# Patient Record
Sex: Female | Born: 1975 | Race: White | Hispanic: No | Marital: Married | Smoking: Former smoker
Health system: Southern US, Community
[De-identification: ages and names within clinical notes are randomized; demographics above are authoritative.]

## PROBLEM LIST (undated history)

## (undated) DIAGNOSIS — E785 Hyperlipidemia, unspecified: Secondary | ICD-10-CM

## (undated) DIAGNOSIS — K589 Irritable bowel syndrome without diarrhea: Secondary | ICD-10-CM

## (undated) HISTORY — DX: Hyperlipidemia, unspecified: E78.5

## (undated) HISTORY — PX: EXPLORATORY LAPAROTOMY: SUR591

## (undated) HISTORY — DX: Irritable bowel syndrome without diarrhea: K58.9

---

## 2005-10-16 ENCOUNTER — Emergency Department: Payer: Self-pay | Admitting: Emergency Medicine

## 2005-10-16 ENCOUNTER — Other Ambulatory Visit: Payer: Self-pay

## 2005-11-01 ENCOUNTER — Observation Stay: Payer: Self-pay | Admitting: Obstetrics and Gynecology

## 2006-05-23 ENCOUNTER — Encounter: Payer: Self-pay | Admitting: Obstetrics and Gynecology

## 2007-11-06 ENCOUNTER — Ambulatory Visit: Payer: Self-pay | Admitting: Family Medicine

## 2007-11-06 ENCOUNTER — Encounter: Payer: Self-pay | Admitting: Obstetrics & Gynecology

## 2007-12-17 ENCOUNTER — Ambulatory Visit: Payer: Self-pay | Admitting: Family Medicine

## 2007-12-17 DIAGNOSIS — M4120 Other idiopathic scoliosis, site unspecified: Secondary | ICD-10-CM | POA: Insufficient documentation

## 2007-12-17 DIAGNOSIS — N809 Endometriosis, unspecified: Secondary | ICD-10-CM | POA: Insufficient documentation

## 2007-12-17 DIAGNOSIS — R5383 Other fatigue: Secondary | ICD-10-CM

## 2007-12-17 DIAGNOSIS — E785 Hyperlipidemia, unspecified: Secondary | ICD-10-CM | POA: Insufficient documentation

## 2007-12-17 DIAGNOSIS — R5381 Other malaise: Secondary | ICD-10-CM

## 2007-12-17 DIAGNOSIS — K589 Irritable bowel syndrome without diarrhea: Secondary | ICD-10-CM

## 2008-01-30 ENCOUNTER — Ambulatory Visit: Payer: Self-pay | Admitting: Family Medicine

## 2008-01-30 DIAGNOSIS — J069 Acute upper respiratory infection, unspecified: Secondary | ICD-10-CM | POA: Insufficient documentation

## 2008-05-16 ENCOUNTER — Telehealth: Payer: Self-pay | Admitting: Family Medicine

## 2008-07-31 ENCOUNTER — Ambulatory Visit: Payer: Self-pay | Admitting: Family Medicine

## 2008-07-31 DIAGNOSIS — K921 Melena: Secondary | ICD-10-CM | POA: Insufficient documentation

## 2008-07-31 DIAGNOSIS — R1031 Right lower quadrant pain: Secondary | ICD-10-CM

## 2008-07-31 DIAGNOSIS — K5900 Constipation, unspecified: Secondary | ICD-10-CM | POA: Insufficient documentation

## 2008-08-04 LAB — CONVERTED CEMR LAB
ALT: 16 units/L (ref 0–35)
AST: 13 units/L (ref 0–37)
BUN: 9 mg/dL (ref 6–23)
Basophils Absolute: 0.1 10*3/uL (ref 0.0–0.1)
Basophils Relative: 1.4 % (ref 0.0–3.0)
Bilirubin, Direct: 0.1 mg/dL (ref 0.0–0.3)
CO2: 26 meq/L (ref 19–32)
Folate: 14.3 ng/mL
GFR calc Af Amer: 126 mL/min
Glucose, Bld: 90 mg/dL (ref 70–99)
HCT: 35.2 % — ABNORMAL LOW (ref 36.0–46.0)
Hemoglobin: 12.2 g/dL (ref 12.0–15.0)
Lymphocytes Relative: 36.9 % (ref 12.0–46.0)
MCV: 90.5 fL (ref 78.0–100.0)
Monocytes Absolute: 0.5 10*3/uL (ref 0.1–1.0)
Neutrophils Relative %: 52.4 % (ref 43.0–77.0)
Platelets: 223 10*3/uL (ref 150–400)
RBC: 3.89 M/uL (ref 3.87–5.11)
TSH: 2.32 microintl units/mL (ref 0.35–5.50)
Total Bilirubin: 0.4 mg/dL (ref 0.3–1.2)
Total Protein: 7 g/dL (ref 6.0–8.3)
Vitamin B-12: 395 pg/mL (ref 211–911)
WBC: 6.5 10*3/uL (ref 4.5–10.5)

## 2008-08-05 ENCOUNTER — Ambulatory Visit: Payer: Self-pay | Admitting: Gastroenterology

## 2008-09-30 ENCOUNTER — Ambulatory Visit: Payer: Self-pay | Admitting: Gastroenterology

## 2008-10-14 ENCOUNTER — Ambulatory Visit: Payer: Self-pay | Admitting: Internal Medicine

## 2008-10-14 ENCOUNTER — Telehealth: Payer: Self-pay | Admitting: Internal Medicine

## 2008-10-24 ENCOUNTER — Telehealth (INDEPENDENT_AMBULATORY_CARE_PROVIDER_SITE_OTHER): Payer: Self-pay | Admitting: *Deleted

## 2008-10-24 ENCOUNTER — Telehealth: Payer: Self-pay | Admitting: Gastroenterology

## 2008-10-24 ENCOUNTER — Ambulatory Visit: Payer: Self-pay | Admitting: Gastroenterology

## 2008-10-24 LAB — CONVERTED CEMR LAB: hCG, Beta Chain, Quant, S: 0.62 milliintl units/mL

## 2008-10-27 ENCOUNTER — Ambulatory Visit: Payer: Self-pay | Admitting: Gastroenterology

## 2008-10-28 ENCOUNTER — Telehealth (INDEPENDENT_AMBULATORY_CARE_PROVIDER_SITE_OTHER): Payer: Self-pay | Admitting: *Deleted

## 2008-11-17 ENCOUNTER — Encounter: Payer: Self-pay | Admitting: Obstetrics and Gynecology

## 2008-11-17 ENCOUNTER — Ambulatory Visit: Payer: Self-pay | Admitting: Obstetrics and Gynecology

## 2009-02-05 ENCOUNTER — Ambulatory Visit: Payer: Self-pay | Admitting: Obstetrics and Gynecology

## 2009-04-28 ENCOUNTER — Ambulatory Visit: Payer: Self-pay | Admitting: Obstetrics and Gynecology

## 2009-08-04 ENCOUNTER — Ambulatory Visit: Payer: Self-pay | Admitting: Obstetrics & Gynecology

## 2009-08-25 ENCOUNTER — Ambulatory Visit: Payer: Self-pay | Admitting: Family Medicine

## 2009-08-25 DIAGNOSIS — J019 Acute sinusitis, unspecified: Secondary | ICD-10-CM | POA: Insufficient documentation

## 2009-08-28 ENCOUNTER — Telehealth: Payer: Self-pay | Admitting: Family Medicine

## 2010-04-20 ENCOUNTER — Ambulatory Visit: Payer: Self-pay | Admitting: Family Medicine

## 2010-05-05 ENCOUNTER — Ambulatory Visit: Payer: Self-pay | Admitting: Obstetrics and Gynecology

## 2010-05-05 LAB — CONVERTED CEMR LAB
RDW: 14.1 % (ref 11.5–15.5)
WBC: 9.5 10*3/uL (ref 4.0–10.5)

## 2010-06-25 ENCOUNTER — Ambulatory Visit: Payer: Self-pay | Admitting: Family Medicine

## 2010-06-25 DIAGNOSIS — N644 Mastodynia: Secondary | ICD-10-CM | POA: Insufficient documentation

## 2010-07-05 ENCOUNTER — Ambulatory Visit: Payer: Self-pay | Admitting: Obstetrics & Gynecology

## 2010-07-05 LAB — CONVERTED CEMR LAB
Antibody Screen: NEGATIVE
Basophils Relative: 1 % (ref 0–1)
Eosinophils Absolute: 0.1 10*3/uL (ref 0.0–0.7)
Eosinophils Relative: 2 % (ref 0–5)
Hemoglobin: 12.4 g/dL (ref 12.0–15.0)
Hepatitis B Surface Ag: NEGATIVE
Monocytes Absolute: 0.5 10*3/uL (ref 0.1–1.0)
Monocytes Relative: 7 % (ref 3–12)
Platelets: 256 10*3/uL (ref 150–400)
RBC: 4.05 M/uL (ref 3.87–5.11)
RDW: 13.4 % (ref 11.5–15.5)
WBC: 7.8 10*3/uL (ref 4.0–10.5)
hCG, Beta Chain, Quant, S: 479.2 milliintl units/mL

## 2010-07-12 ENCOUNTER — Ambulatory Visit: Payer: Self-pay | Admitting: Obstetrics and Gynecology

## 2010-07-12 ENCOUNTER — Encounter: Payer: Self-pay | Admitting: Obstetrics & Gynecology

## 2010-07-26 ENCOUNTER — Ambulatory Visit (HOSPITAL_COMMUNITY): Admission: RE | Admit: 2010-07-26 | Payer: Self-pay | Admitting: Family Medicine

## 2010-07-27 ENCOUNTER — Ambulatory Visit: Payer: Self-pay | Admitting: Obstetrics & Gynecology

## 2010-08-04 ENCOUNTER — Ambulatory Visit: Payer: Self-pay | Admitting: Obstetrics & Gynecology

## 2010-08-05 ENCOUNTER — Emergency Department: Payer: Self-pay | Admitting: Emergency Medicine

## 2010-09-09 ENCOUNTER — Encounter: Payer: Self-pay | Admitting: Maternal & Fetal Medicine

## 2010-09-21 NOTE — Progress Notes (Signed)
Summary: sinus infection no better/cold sore  Phone Note Call from Patient Call back at Work Phone [phone]   Caller: Patient Call For: [name] Summary of Call: Patient was seen on Tuesday for sinus infection and it doesn't seem to be getting any better. Wants to know if antibiotic needs more time or if she needs something different called in. She says that she also has a cold sore now and would like something called in for that as well. [facility]. Pleas advise.  Initial call taken by: Melody Comas,  August 28, 2009 4:10 PM  Follow-up for Phone Call        Give time at least to day 8-10 of antibitoics.  Follow-up by: [name],  August 28, 2009 5:12 PM  Additional Follow-up for Phone Call Additional follow up Details #1::        Patient notified as instructed.  She says she would like something called in for the fever blisters.  I advised her to use OTC medication for the fever blisters but she wants something called in to make them go away and or to prevent them from coming back.  Please advise Additional Follow-up by: Linde Gillis CMA Duncan Dull),  August 31, 2009 9:18 AM    Additional Follow-up for Phone Call Additional follow up Details #2::    Patient called and says that she is still not feeling any better and that she feels that it has had time to kick in. Also still want something called in for the cold sore to the [facility]. 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 Chronic medications are not used on a daily basis for cold sores, but OK to use at outbreak.  call in Additional Follow-up by: Hannah Beat Edwards,  August 31, 2009 10:25 AM  New/Updated Medications: AUGMENTIN 875-125  MG TABS (AMOXICILLIN-POT CLAVULANATE) 1 by mouth two times a day ACYCLOVIR 400 MG TABS (ACYCLOVIR) 1 by mouth three times a day Prescriptions: ACYCLOVIR 400 MG TABS (ACYCLOVIR) 1 by mouth three times a day  #15 x 0   Entered and Authorized by:   Hannah Beat Edwards   Signed by:   Benny Lennert CMA (AAMA) on 08/31/2009   Method used:   Telephoned to ...       [facility] [phone]* (retail)       [address]       [place], [place]  [address]       Ph: [phone] or [phone]       Fax: [phone]   RxID:   [id number] AUGMENTIN 875-125 MG TABS (AMOXICILLIN-POT CLAVULANATE) 1 by mouth two times a day  #20 x 0   Entered and Authorized by:   Hannah Beat Edwards   Signed by:   Benny Lennert CMA (AAMA) on 08/31/2009   Method used:   Telephoned to ...       [facility] [phone]* (retail)       [address]       [place], [place]  [address]       Ph: [phone] or [phone]  Fax: [phone]   RxID:   [id number]

## 2010-09-21 NOTE — Assessment & Plan Note (Signed)
Summary: SINUS INFECTION/RBH   Vital Signs:  Patient profile:   35 year old female Height:      59.5 inches Weight:      136.8 pounds BMI:     27.27 Temp:     98.2 degrees F oral Pulse rate:   80 / minute Pulse rhythm:   regular BP sitting:   120 / 70  (left arm) Cuff size:   regular  Vitals Entered By: Benny Lennert CMA Duncan Dull) (August 25, 2009 9:11 AM)  History of Present Illness: Chief complaint sinus imfection for 3 weeks  Acute Visit History:      The patient complains of cough, earache, headache, nasal discharge, and sinus problems.  These symptoms began 3 weeks ago.  She denies fever and sore throat.  Other comments include: Using cold and sinus med.        The earache is located on both sides.        She complains of sinus pressure, teeth aching, ears being blocked, nasal congestion, and purulent drainage.        Problems Prior to Update: 1)  Pregnancy Exam or Test, Unconfirmed  (ICD-V72.40) 2)  Abdominal Pain, Right Lower Quadrant  (ICD-789.03) 3)  Constipation  (ICD-564.00) 4)  Hematochezia  (ICD-578.1) 5)  Uri  (ICD-465.9) 6)  Fatigue  (ICD-780.79) 7)  Ibs  (ICD-564.1) 8)  Hyperlipidemia  (ICD-272.4) 9)  Scoliosis  (ICD-737.30) 10)  Endometriosis, Site Unspecified  (ICD-617.9)  Current Medications (verified): 1)  Bcp .... Once Daily  Allergies: 1)  ! Prednisone  Past History:  Past medical, surgical, family and social histories (including risk factors) reviewed, and no changes noted (except as noted below).  Past Medical History: Reviewed history from 08/05/2008 and no changes required. Hyperlipidemia endometriosis told she had IBS by [facility] clinic GI MD at age 89  Past Surgical History: Reviewed history from 08/05/2008 and no changes required. 2004 ex lap: endometriosis 2007 NSVD, placental abruption   Family History: Reviewed history from 08/05/2008 and no changes required. father: HTN, high chol, DM, CAD age 48 mother: HTN siblings:  healthy PGM: melanoma MGM: HTN great aunt with colon cancer  Social History: Reviewed history from 08/05/2008 and no changes required. 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 Resp:  Denies cough and shortness of breath. GI:  Complains of nausea; denies abdominal pain and bloody stools.  Physical Exam  General:  fatigued appearing female in NAd Head:  left maxillary sinus ttp Ears:  clear fluid in TMS B, no erythema Nose:  nasal discharge, no mucosal pallor.   Mouth:  MMM, good dentition and pharynx pink and moist.   Neck:  no cervical or supraclavicular lymphadenopathy  Lungs:  Normal respiratory effort, chest expands symmetrically. Lungs are clear to auscultation, no crackles or wheezes. Heart:  Normal rate and regular rhythm. S1 and S2 normal without gallop, murmur, click, rub or other extra sounds.   Impression & Recommendations:  Problem # 1:  SINUSITIS - ACUTE-NOS (ICD-461.9) Nasal saline 3-4 times daily, mucinex 1200 mg two times a day Start antibiotics.  Call if not improving as expected.  The following medications were removed from the medication list:    Mucinex 600 Mg Xr12h-tab (Guaifenesin) .Marland Kitchen... Take 1 tablet by mouth two times a day as needed    Robitussin Dm 100-10 Mg/72ml Syrp (Dextromethorphan-guaifenesin) .Marland Kitchen... Take  2 teaspoons by mouth at bedtime as needed    Zithromax Z-pak 250 Mg Tabs (Azithromycin) ..... Use as directed Her updated medication list for this problem includes:    Amoxicillin 500 Mg Tabs (Amoxicillin) .Marland Kitchen... 2 tab by mouth two times a day x 10 days  Complete Medication List: 1)  Bcp  .... Once daily 2)  Amoxicillin 500 Mg Tabs (Amoxicillin) .... 2 tab by mouth two times a day x 10 days  Patient Instructions: 1)  NASal saline 3-4 times daily, mucinex 1200 mg two times a  day 2)  Start antibiotics.  3)  Call if not improving as expected.  Prescriptions: AMOXICILLIN 500 MG TABS (AMOXICILLIN) 2 tab by mouth two times a day x 10 days  #40 x 0   Entered and Authorized by:   Kerby Nora MD   Signed by:   Kerby Nora MD on 08/25/2009   Method used:   Electronically to        [facility] [phone]* (retail)       [address]       [place], [place]  [address]       Ph: [phone] or [phone]       Fax: [phone]   RxID:   [id number]   Current Allergies (reviewed today): ! PREDNISONE

## 2010-09-21 NOTE — Assessment & Plan Note (Signed)
Summary: ONE BREAST IS LARGER   Vital Signs:  Patient profile:   35 year old female Height:      59.5 inches Weight:      138 pounds BMI:     27.51 Temp:     97.7 degrees F oral Pulse rate:   76 / minute Pulse rhythm:   regular BP sitting:   120 / 80  (right arm) Cuff size:   regular  Vitals Entered By: Linde Gillis CMA Duncan Dull) (June 25, 2010 3:03 PM) CC: left breast tenderness   History of Present Illness: G1P1 here for left breast tenderness.  She and her husband are trying to get pregnant. LMP 06/01/2010.  Noticed a few days ago that left breast seemed larger and more tender. No palpable mass, nipple discharge, nipple changes, or erythema. No fevers or chills. No erythema.  No nausea or vomiting. +fatigue but thinks that is due to work schedule.  Current Medications (verified): 1)  Prenatal Multivit-Iron  Tabs (Prenatal Vit-Fe Sulfate-Fa) .... Take One Tablet By Mouth Daily 2)  Vitamin C 500 Mg Chew (Ascorbic Acid) .... Take One Tablet By Mouth Daily  Allergies: 1)  ! 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 GI:  Denies nausea and vomiting.  Physical Exam  General:  fatigued appearing in NAD Breasts:  No  mass, nodules, thickening, tenderness, bulging, retraction, inflamation, nipple discharge or skin changes noted.   Abdomen:  Bowel sounds positive,abdomen soft and non-tender without masses, organomegaly or hernias noted. Psych:  Cognition and judgment appear intact. Alert and cooperative with normal attention span and concentration. No apparent delusions, illusions, hallucinations   Impression & Recommendations:  Problem # 1:  BREAST TENDERNESS (ICD-611.71) Assessment New U preg negative and exam unremarkable.  Offered serum hcg to r/o pregnancy but pt would prefer to wait a week and repeat upreg. Advised if breast tenderness worsens or develops other symptoms, to follow up immediately. Orders: Urine Pregnancy Test  (16109)  Complete Medication List: 1)  Prenatal Multivit-iron Tabs (Prenatal vit-fe sulfate-fa) .... Take one tablet by mouth daily 2)  Vitamin C 500 Mg Chew (Ascorbic acid) .... Take one tablet by mouth daily   Orders Added: 1)  Urine Pregnancy Test  [81025] 2)  Est. Patient Level III [60454]    Current Allergies (reviewed today): ! PREDNISONE  Laboratory Results   Urine Tests      Urine HCG: negative

## 2010-09-21 NOTE — Assessment & Plan Note (Signed)
Summary: 10:15 ST/CLE   Vital Signs:  Patient profile:   35 year old female Height:      59.5 inches Weight:      140.6 pounds BMI:     28.02 Temp:     98.8 degrees F oral Pulse rate:   80 / minute Pulse rhythm:   regular BP sitting:   100 / 70  (left arm) Cuff size:   regular  Vitals Entered By: Benny Lennert CMA Duncan Dull) (April 20, 2010 10:37 AM)  History of Present Illness: Chief complaint sore throat  Acute Visit History:      The patient complains of cough, earache, nasal discharge, and sore throat.  These symptoms began 3 days ago.  She denies chest pain and sinus problems.  Other comments include: significant fatigue no sneeze  some PND  dry skin in past few months. Occ feels liek she cannot get enough air..ongoing x 1 month.        The character of the cough is described as nonproductive.  There is no history of wheezing or sleep interference associated with her cough.        Earache symptom: left ear with swallowing, talking.        Problems Prior to Update: 1)  Sinusitis - Acute-nos  (ICD-461.9) 2)  Pregnancy Exam or Test, Unconfirmed  (ICD-V72.40) 3)  Abdominal Pain, Right Lower Quadrant  (ICD-789.03) 4)  Constipation  (ICD-564.00) 5)  Hematochezia  (ICD-578.1) 6)  Uri  (ICD-465.9) 7)  Fatigue  (ICD-780.79) 8)  Ibs  (ICD-564.1) 9)  Hyperlipidemia  (ICD-272.4) 10)  Scoliosis  (ICD-737.30) 11)  Endometriosis, Site Unspecified  (ICD-617.9)  Current Medications (verified): 1)  Bcp .... Once Daily  Allergies: 1)  ! Prednisone  Past History:  Past medical, surgical, family and social histories (including risk factors) reviewed, and no changes noted (except as noted below).  Past Medical History: Reviewed history from 08/05/2008 and no changes required. Hyperlipidemia endometriosis told she had IBS by [facility] clinic GI MD at age 47  Past Surgical History: Reviewed history from 08/05/2008 and no changes required. 2004 ex lap: endometriosis 2007 NSVD,  placental abruption   Family History: Reviewed history from 08/05/2008 and no changes required. father: HTN, high chol, DM, CAD age 31 mother: HTN siblings: healthy PGM: melanoma MGM: HTN great aunt with colon cancer  Social History: Reviewed history from 08/05/2008 and no changes required. 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 GI:  Denies abdominal pain. GU:  Denies dysuria.  Physical Exam  General:  fatigued appearing in NAD Ears:  External ear exam shows no significant lesions or deformities.  Otoscopic examination reveals clear canals, tympanic membranes are intact bilaterally without bulging, retraction, inflammation or discharge. Hearing is grossly normal bilaterally. Nose:  External nasal examination shows no deformity or inflammation. Nasal mucosa are pink and moist without lesions or exudates. Mouth:  pharyngeal erythema, post nasal drip Neck:  no cervical or supraclavicular lymphadenopathy  Lungs:  Normal respiratory effort, chest expands symmetrically. Lungs are clear to auscultation, no crackles or wheezes. Heart:  Normal rate and regular rhythm. S1 and S2 normal without gallop, murmur, click, rub or other extra sounds.   Impression & Recommendations:  Problem # 1:  URI (ICD-465.9) Treatr symptomatically. Follow up if not improving as expected.  No clear sign of allergies or bacterial infection.  Complete Medication List: 1)  Bcp  .... Once daily  Patient Instructions: 1)  Guafenesin twice daily  and tylenol as needed pain. 2)  Call if breathing issues continue or fatigue no resolving.  Current Allergies (reviewed today): ! PREDNISONE

## 2010-09-22 ENCOUNTER — Ambulatory Visit: Payer: Self-pay | Admitting: Oncology

## 2010-10-07 ENCOUNTER — Encounter: Payer: Self-pay | Admitting: Obstetrics & Gynecology

## 2010-10-21 ENCOUNTER — Ambulatory Visit: Payer: Self-pay | Admitting: Oncology

## 2010-11-15 ENCOUNTER — Observation Stay: Payer: Self-pay

## 2011-01-04 NOTE — Assessment & Plan Note (Signed)
NAME:  [name], HENNER NO.:  [id number]   MEDICAL RECORD NO.:  [id number]          PATIENT TYPE:  POB   LOCATION:  [facility] at [facility]         FACILITY:  [facility]   PHYSICIAN:  Argentina Donovan, MD        DATE OF BIRTH:  1976/01/14   DATE OF SERVICE:                                  CLINIC NOTE   HISTORY OF PRESENT ILLNESS:  The patient is a 35 year old Caucasian  female, gravida 1, para 1-0-0-1 with a child 59 years old with a long  history prior to that of endometriosis diagnosed and treated by  laparoscopy when she was in her early 57s.  She had been treated with  danazol in the past and had severe side effects from that.  When we last  saw her she started on Depo-Provera and that was over a year ago, she  stopped the Depo-Provera in the early part of this year and then began  regular periods from May on.  She has had some dysmenorrhea that starts  2 days before the period but it is manageable at this point.  In  addition to this, she started on prenatal vitamins because her object  just to try and conceive, we talked about that for while.  We talked to  her about review of systems which only the medicine she is taking and  generally negative with exception of the dysmenorrhea.   ALLERGIES:  Only to PREDNISONE.   CURRENT MEDICATIONS:  Prenatal vitamins.   PHYSICAL EXAMINATION:  GENERAL:  She is a well-developed, well-nourished  Caucasian female in no acute distress.  VITAL SIGNS:  Blood pressure 111/81, pulse is 73 per minute, her weight  is 140 pounds, and she is 4 feet 11 inches tall.  HEENT:  Normocephalic and atraumatic.  PERRLA within normal limits.  NECK:  Supple.  Thyroid symmetrical, no dominant masses.  The backs  erect.  No CVA tenderness.  LUNGS:  Clear to auscultation and percussion.  HEART:  No murmur.  Normal sinus rhythm.  ABDOMEN:  Soft, flat, and nontender.  No masses or organomegaly.  EXTREMITIES:  No edema.  No varicosities.  NEUROLOGIC:  DTRs  within normal limits.  GENITALIA:  External appears normal.  BUS within normal limits.  Vagina  is clean and well rugated.  The cervix is clean with a very slight  ectropion above 0.5 cm in radius.  The uterus is anterior, normal size,  shape, consistency.  The adnexa is normal.  No sign of masses in the  pelvis.   IMPRESSION:  Normal gynecological examination.  The patient with  endometriosis by history.   PLAN:  Attempting conception.          ______________________________  Argentina Donovan, MD    PR/MEDQ  D:  05/05/2010  T:  05/05/2010  Job:  [id number]

## 2011-01-04 NOTE — Assessment & Plan Note (Signed)
NAMECYNDI, [name] NO.:  [id number]   MEDICAL RECORD NO.:  [id number]          PATIENT TYPE:  POB   LOCATION:  [facility] at [facility]         FACILITY:  [facility]   PHYSICIAN:  Argentina Donovan, MD        DATE OF BIRTH:  January 29, 1976   DATE OF SERVICE:                                  CLINIC NOTE   The patient is a 35 year old Caucasian female gravida 1, para 1-0-0-1  with a long history of endometriosis diagnosed by laparoscopy when she  was in her early 60s and for a while was placed on danazol with the side  effects were terrible.  She lost her hair, she developed acne, and  abnormal hair growth.  Since the birth of her baby, she has been  continuing Ovcon 3 months without a period and then one period, but even  with that, the periods have been terrible.  She has developed  dyspareunia on deep penetration and so gradually starts to avoid  intimacy.  We have talked her about the alternatives she thinking about  having another child in a year.  We thought maybe if she stopped her  periods completely, we try her on Depo-Provera, which seems to help a  lot.  I have talked to her about the possibility of side effects such as  weight gain, depression, and she thinks she would like to give this a  try.  We will go to put her on Depo-Provera 150 every 3 months and an  attempt to control the endometriosis.  In addition to this, the patient  is in good health.  Review of systems are negative with exception of  present illness.   PHYSICAL EXAMINATION:  VITAL SIGNS:  Blood pressure is 122/53, pulse is  73.  The patient is 4 feet 11 inches tall, weighs 128 pounds.  GENERAL:  Well-developed, well-nourished white female in no acute  distress.  HEENT:  Within normal limits.  NECK:  Supple.  Thyroid symmetrical with no masses.  BACK:  Erect, although despite slight scoliosis.  BREASTS:  Symmetrical with no dominant masses.  No nipple discharge.  No  supraclavicular or axillary  nodes.  LUNGS:  Clear to auscultation and percussion.  HEART:  No murmur, normal sinus rhythm.  ABDOMEN:  Soft, flat, nontender.  No masses or organomegaly.  EXTREMITIES:  No edema.  No varicosities.  SKIN:  Normal turgor and pallor.  GENITALIA:  External genitalia is normal.   BUN is within normal limits.  The vagina is clean and well rugated.  The  cervix is clean and parous.  The uterus is anterior with normal size,  shape, consistency.  No uterosacral nodules could be noted and the  adnexa could not be well palpated.   IMPRESSION:  Normal gynecological examination.  The patient has severe  endometriosis by history of severe dysmenorrhea and dyspareunia.   PLAN:  Depo-Provera, vitamin D.  We have also encouraged her to take  supplemental calcium at 600 mg b.i.d.           ______________________________  Argentina Donovan, MD     PR/MEDQ  D:  11/17/2008  T:  11/18/2008  Job:  [id number]

## 2011-02-10 ENCOUNTER — Observation Stay: Payer: Self-pay

## 2011-02-14 ENCOUNTER — Observation Stay: Payer: Self-pay

## 2011-02-26 ENCOUNTER — Inpatient Hospital Stay: Payer: Self-pay

## 2011-04-18 ENCOUNTER — Encounter: Payer: Self-pay | Admitting: Family Medicine

## 2011-04-19 ENCOUNTER — Ambulatory Visit (INDEPENDENT_AMBULATORY_CARE_PROVIDER_SITE_OTHER): Payer: Self-pay | Admitting: Family Medicine

## 2011-04-19 ENCOUNTER — Encounter: Payer: Self-pay | Admitting: Family Medicine

## 2011-04-19 DIAGNOSIS — K439 Ventral hernia without obstruction or gangrene: Secondary | ICD-10-CM | POA: Insufficient documentation

## 2011-04-19 NOTE — Progress Notes (Signed)
  Subjective:    Patient ID: [name], female    DOB: [birth year], 35 y.o.   MRN: [id number]  HPI  Amanda Steuart, a 35 y.o. female presents today in the office for the following:    During pregnancy (now 7 weeks post-partum) from natural child birth and additionally with a 32 yo child NSVD, developed small area that was palpable while standing caudal to her umbilicus. Occ will be tender to palpation.    Review of Systems ROS: GEN: No acute illnesses, no fevers, chills. GI: No n/v/d, eating normally Pulm: No SOB Interactive and getting along well at home.  Otherwise, ROS is as per the HPI.     Objective:   Physical Exam   Physical Exam  Blood pressure 120/60, pulse 72, temperature 98.6 F (37 C), temperature source Oral, height 4\' 11"  (1.499 m), weight 145 lb (65.772 kg), SpO2 98.00%.  GEN: WDWN, NAD, Non-toxic, A & O x 3 HEENT: Atraumatic, Normocephalic. Neck supple. No masses, No LAD. Ears and Nose: No external deformity. ABD: S, NT, ND, +BS. No rebound tenderness. No HSM. While standing, small palpable area felt along midline several CM caudal to umbilicus EXTR: No c/c/e NEURO Normal gait.  PSYCH: Normally interactive. Conversant. Not depressed or anxious appearing.  Calm demeanor.        Assessment & Plan:   1. Ventral hernia    Discussed  Reassured.  Rec following for now CTP any sports she wants If enlarging or recurrent, episodic pain, elective hernia repair could be considered

## 2011-04-22 ENCOUNTER — Ambulatory Visit: Payer: Self-pay | Admitting: Family Medicine

## 2011-07-05 ENCOUNTER — Ambulatory Visit: Payer: Self-pay | Admitting: Gastroenterology

## 2015-04-24 ENCOUNTER — Other Ambulatory Visit: Payer: Self-pay | Admitting: Adult Health

## 2015-04-24 DIAGNOSIS — R221 Localized swelling, mass and lump, neck: Secondary | ICD-10-CM

## 2015-04-30 ENCOUNTER — Ambulatory Visit
Admission: RE | Admit: 2015-04-30 | Discharge: 2015-04-30 | Disposition: A | Discharge status: Home / Self Care | Payer: BLUE CROSS/BLUE SHIELD | Source: Ambulatory Visit | Attending: Family Medicine | Admitting: Family Medicine

## 2015-04-30 DIAGNOSIS — R221 Localized swelling, mass and lump, neck: Secondary | ICD-10-CM

## 2015-06-23 ENCOUNTER — Encounter: Payer: Self-pay | Admitting: Gastroenterology

## 2016-10-05 NOTE — Progress Notes (Signed)
This encounter was created in error - please disregard.

## 2016-10-06 NOTE — Progress Notes (Signed)
Erroneous visit

## 2017-03-07 ENCOUNTER — Ambulatory Visit
Admission: RE | Admit: 2017-03-07 | Discharge: 2017-03-07 | Disposition: A | Discharge status: Home / Self Care | Payer: BLUE CROSS/BLUE SHIELD | Source: Ambulatory Visit | Attending: Student | Admitting: Student

## 2017-03-07 ENCOUNTER — Other Ambulatory Visit: Payer: Self-pay | Admitting: Student

## 2017-03-07 DIAGNOSIS — R1013 Epigastric pain: Secondary | ICD-10-CM

## 2017-03-07 DIAGNOSIS — R110 Nausea: Secondary | ICD-10-CM

## 2018-11-08 IMAGING — US US ABDOMEN COMPLETE
1 series · 14 of 25 positions shown · non-contrast
Comparison: 07/05/2011

CLINICAL DATA: acute epigastric pain and nausea.

EXAM:
ABDOMEN ULTRASOUND COMPLETE

[Series 1: us abdomen complete · 0.19mm/px · 14 of 94 slices shown]
[im 1/94]
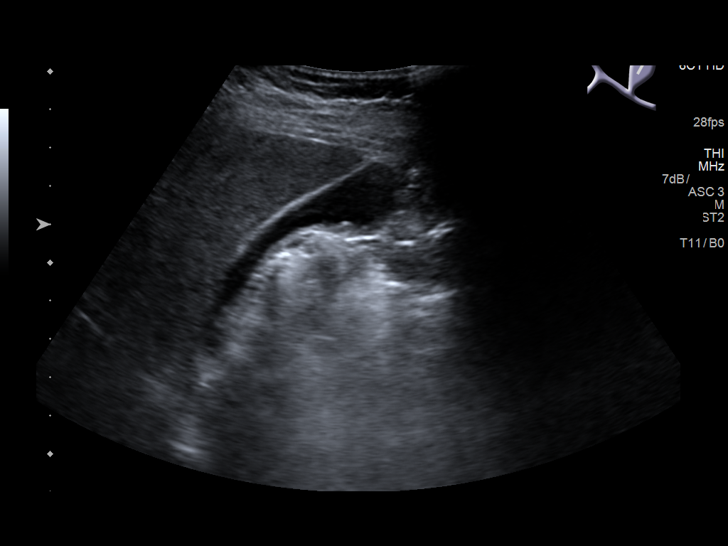
[im 8/94]
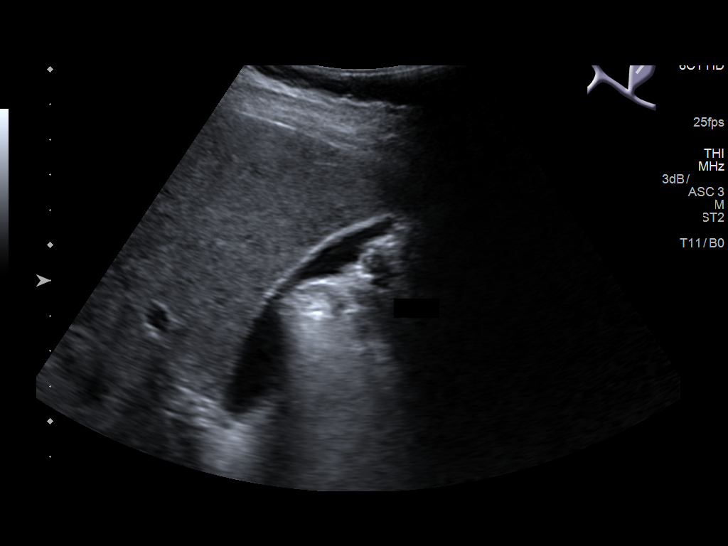
[im 16/94]
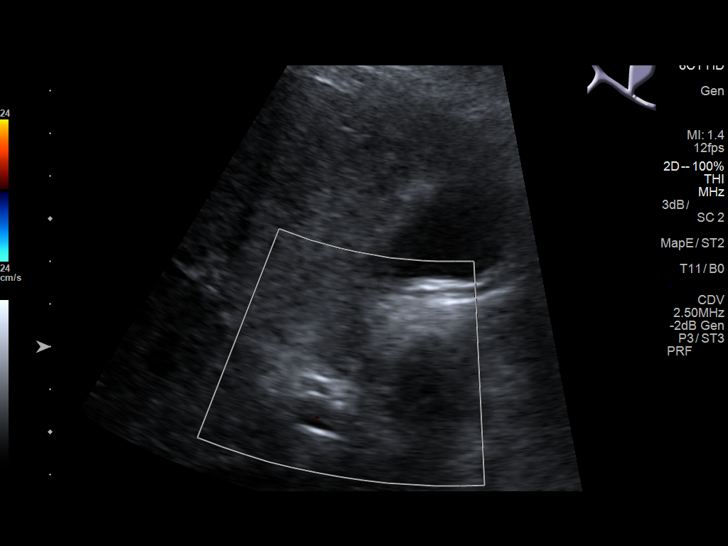
[im 24/94]
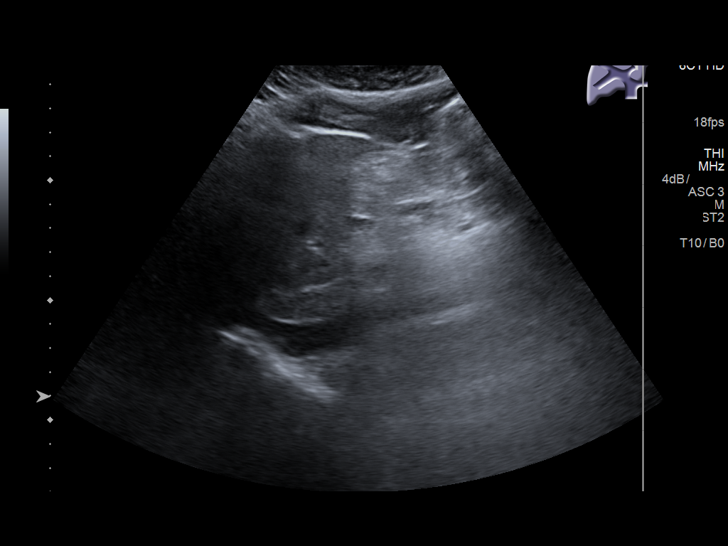
[im 32/94]
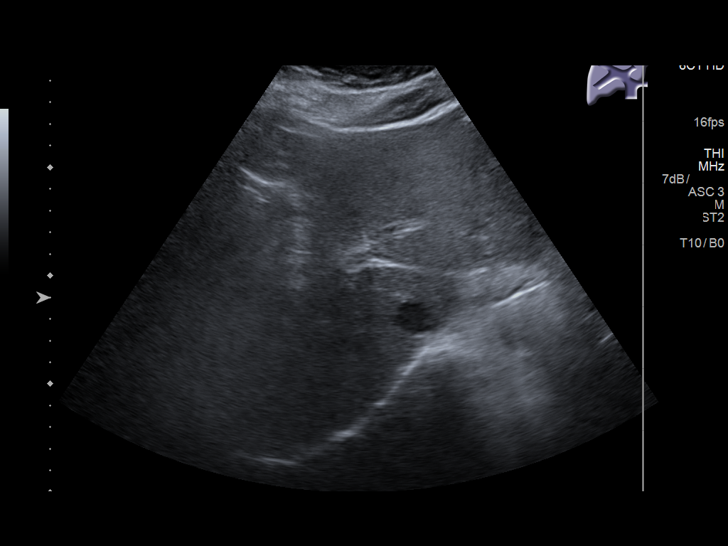
[im 35/94]
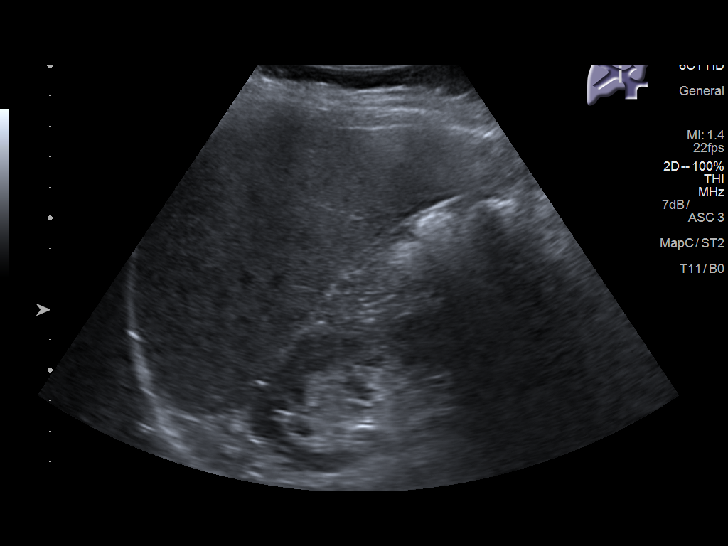
[im 43/94]
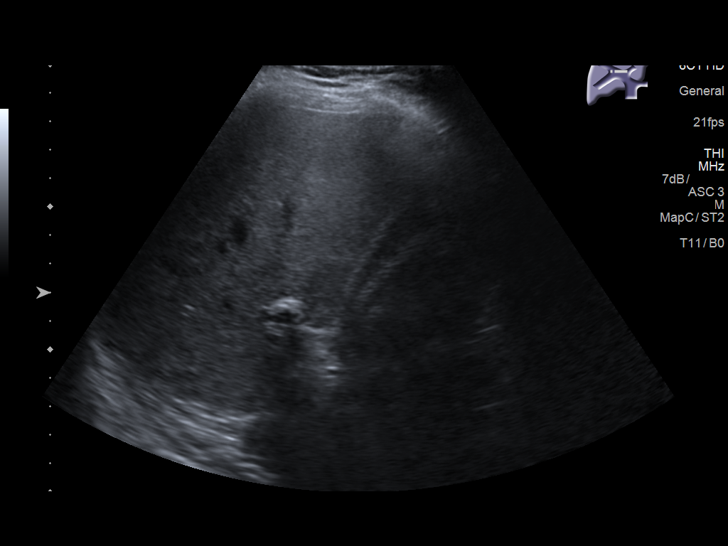
[im 51/94]
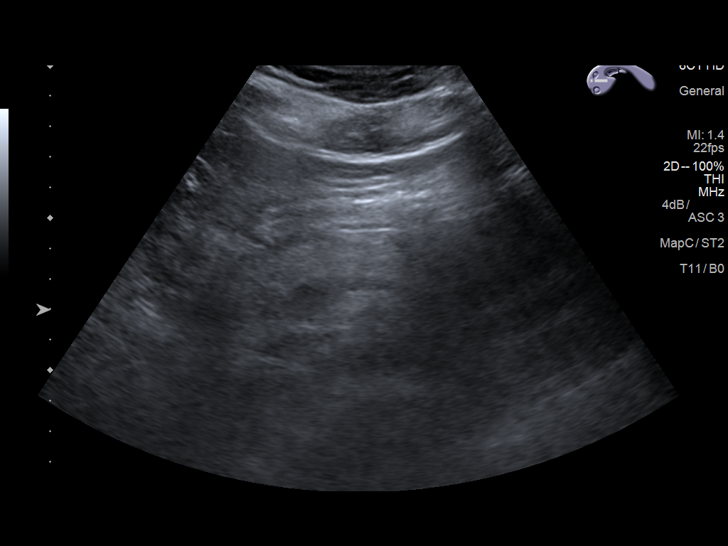
[im 59/94]
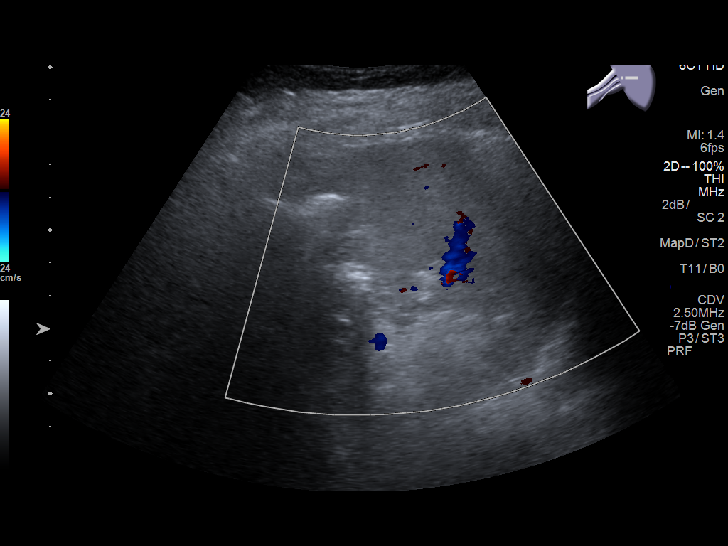
[im 63/94]
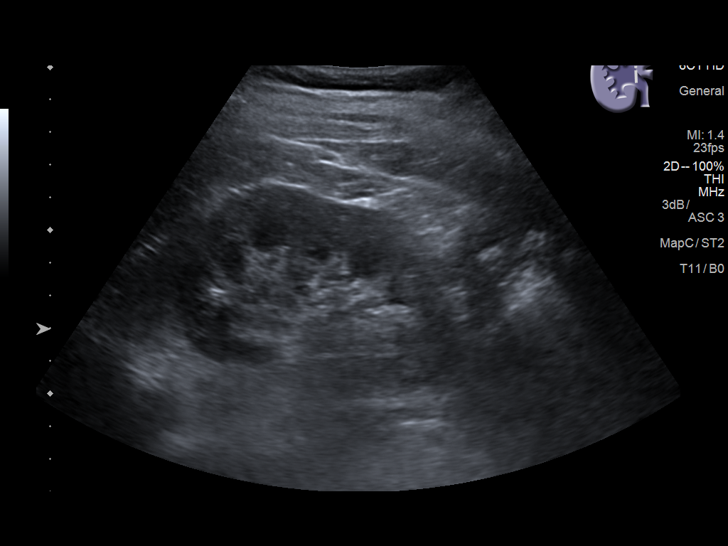
[im 70/94]
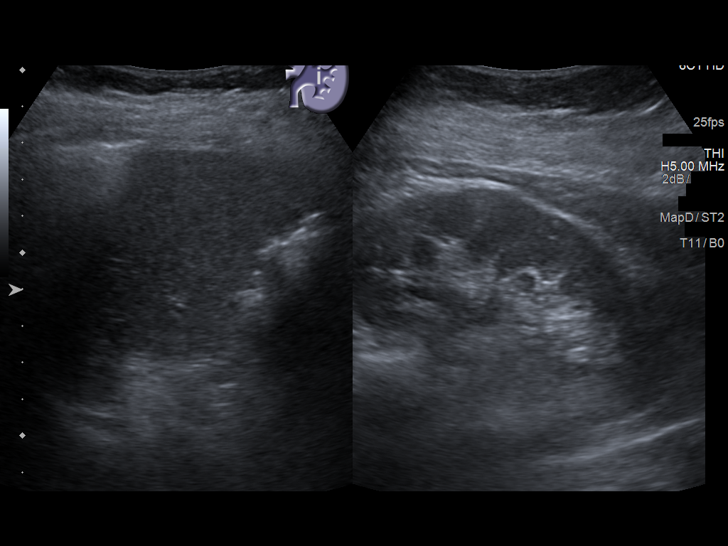
[im 78/94]
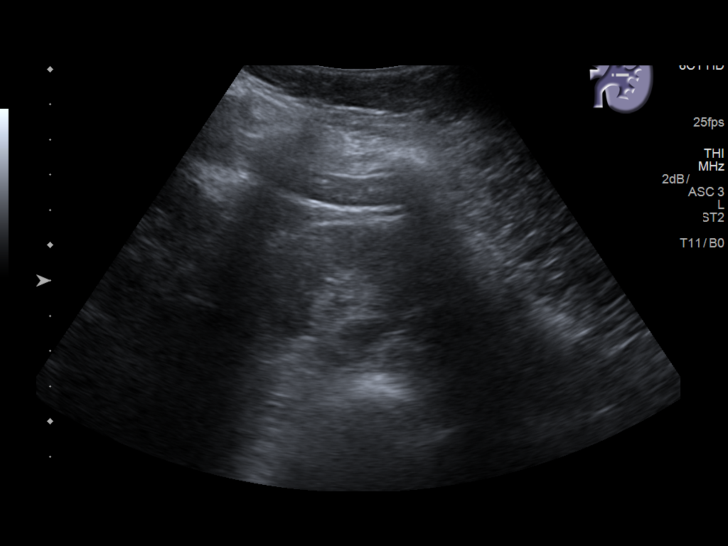
[im 86/94]
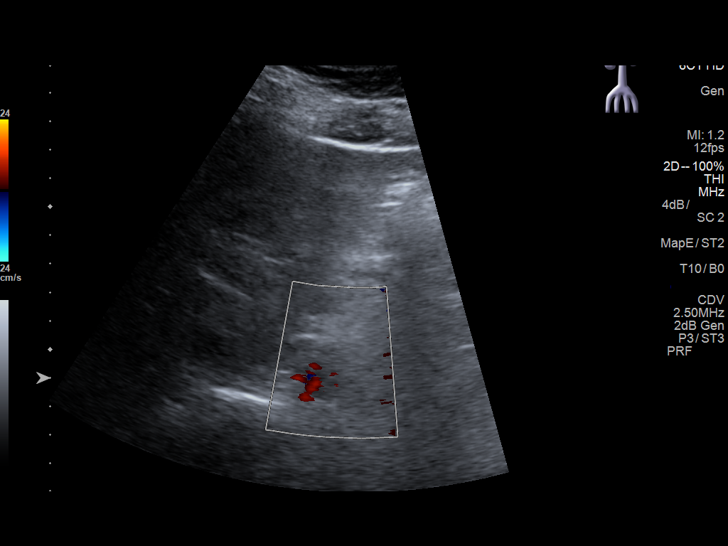
[im 94/94]
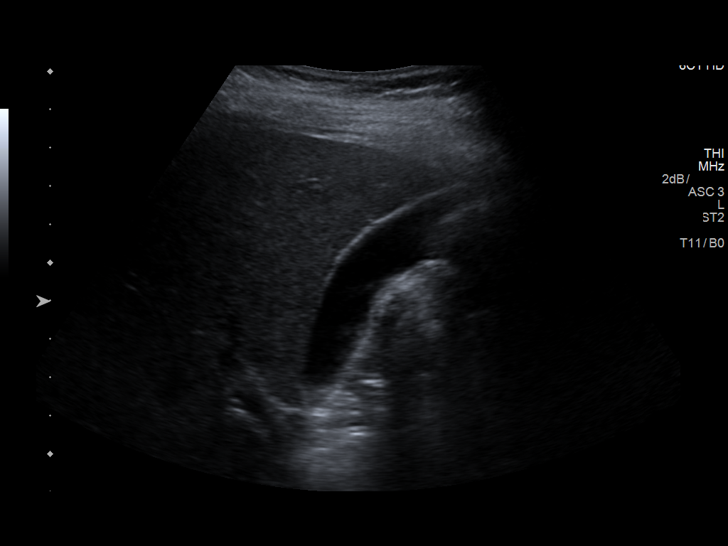

[14 of 25 positions shown; findings below may reference images not displayed]

FINDINGS: Gallbladder: There is a small amount of sludge identified. No
gallstones. No gallbladder wall thickening or pericholecystic fluid.
Negative sonographic Murphy's sign.

Common bile duct: Diameter: 2.7 mm

Liver: No focal lesion identified. Within normal limits in
parenchymal echogenicity.

IVC: No abnormality visualized.

Pancreas: Visualized portion unremarkable.

Spleen: Size and appearance within normal limits.

Right Kidney: Length: 9.5 cm. Echogenicity within normal limits. No
mass or hydronephrosis visualized.

Left Kidney: Length: 9.7 cm. Echogenicity within normal limits. No
mass or hydronephrosis visualized.

Abdominal aorta: No aneurysm visualized.

Other findings: None.
IMPRESSION: 1. No acute findings.
2. Small volume of gallbladder sludge.

## 2020-04-22 ENCOUNTER — Other Ambulatory Visit: Payer: Self-pay

## 2020-04-22 ENCOUNTER — Ambulatory Visit: Payer: No Typology Code available for payment source | Admitting: Dermatology

## 2020-04-22 DIAGNOSIS — L719 Rosacea, unspecified: Secondary | ICD-10-CM

## 2020-04-22 DIAGNOSIS — L738 Other specified follicular disorders: Secondary | ICD-10-CM | POA: Diagnosis not present

## 2020-04-22 NOTE — Progress Notes (Signed)
   New Patient Visit  Subjective  [name] is a 44 y.o. female who presents for the following: bump (R forehead, cheeks >69yr, no symptoms).  The following portions of the chart were reviewed this encounter and updated as appropriate:  Allergies  Meds  Problems  Med Hx  Surg Hx  Fam Hx      Review of Systems:  No other skin or systemic complaints except as noted in HPI or Assessment and Plan.  Objective  Well appearing patient in no apparent distress; mood and affect are within normal limits.  A focused examination was performed including face. Relevant physical exam findings are noted in the Assessment and Plan.  Objective  face: Small yellow papules with a central dell.   Objective  Head - Anterior (Face): Mild erythema mid face   Assessment & Plan  Sebaceous hyperplasia face  Benign, discussed ED, $60 for first lesion, and $15 for each additional txted on the same day.  Discussed risk of recurrence if treated.  Rosacea Head - Anterior (Face)  Mild, no treatment.  Return PRN.  I, Ardis Rowan, RMA, am acting as scribe for Armida Sans, MD .  Documentation: I have reviewed the above documentation for accuracy and completeness, and I agree with the above.  Armida Sans, MD

## 2020-04-27 ENCOUNTER — Encounter: Payer: Self-pay | Admitting: Dermatology

## 2020-04-28 ENCOUNTER — Encounter: Payer: Self-pay | Admitting: Dermatology

## 2020-06-25 ENCOUNTER — Emergency Department: Payer: No Typology Code available for payment source

## 2020-06-25 ENCOUNTER — Other Ambulatory Visit: Payer: Self-pay

## 2020-06-25 ENCOUNTER — Observation Stay
Admission: EM | Admit: 2020-06-25 | Discharge: 2020-06-26 | Disposition: A | Discharge status: Home / Self Care | Payer: No Typology Code available for payment source | Attending: Surgery | Admitting: Surgery

## 2020-06-25 ENCOUNTER — Encounter: Payer: Self-pay | Admitting: Emergency Medicine

## 2020-06-25 ENCOUNTER — Observation Stay: Payer: No Typology Code available for payment source | Admitting: Certified Registered"

## 2020-06-25 ENCOUNTER — Encounter
Admission: EM | Disposition: A | Discharge status: Home / Self Care | Payer: Self-pay | Source: Home / Self Care | Attending: Emergency Medicine

## 2020-06-25 DIAGNOSIS — K810 Acute cholecystitis: Secondary | ICD-10-CM | POA: Diagnosis not present

## 2020-06-25 DIAGNOSIS — Z20822 Contact with and (suspected) exposure to covid-19: Secondary | ICD-10-CM | POA: Insufficient documentation

## 2020-06-25 DIAGNOSIS — Z23 Encounter for immunization: Secondary | ICD-10-CM | POA: Diagnosis not present

## 2020-06-25 DIAGNOSIS — R1013 Epigastric pain: Secondary | ICD-10-CM | POA: Diagnosis present

## 2020-06-25 DIAGNOSIS — Z87891 Personal history of nicotine dependence: Secondary | ICD-10-CM | POA: Diagnosis not present

## 2020-06-25 DIAGNOSIS — K819 Cholecystitis, unspecified: Secondary | ICD-10-CM

## 2020-06-25 DIAGNOSIS — R1010 Upper abdominal pain, unspecified: Secondary | ICD-10-CM

## 2020-06-25 LAB — CBC WITH DIFFERENTIAL/PLATELET
Abs Immature Granulocytes: 0.06 10*3/uL (ref 0.00–0.07)
Basophils Absolute: 0.1 10*3/uL (ref 0.0–0.1)
Basophils Relative: 0 %
Eosinophils Absolute: 0 10*3/uL (ref 0.0–0.5)
Eosinophils Relative: 0 %
HCT: 34.5 % — ABNORMAL LOW (ref 36.0–46.0)
Hemoglobin: 11.8 g/dL — ABNORMAL LOW (ref 12.0–15.0)
Immature Granulocytes: 0 %
Lymphocytes Relative: 11 %
Lymphs Abs: 1.6 10*3/uL (ref 0.7–4.0)
MCH: 30.6 pg (ref 26.0–34.0)
MCHC: 34.2 g/dL (ref 30.0–36.0)
MCV: 89.4 fL (ref 80.0–100.0)
Monocytes Absolute: 0.4 10*3/uL (ref 0.1–1.0)
Monocytes Relative: 3 %
Neutro Abs: 12.8 10*3/uL — ABNORMAL HIGH (ref 1.7–7.7)
Neutrophils Relative %: 86 %
Platelets: 291 10*3/uL (ref 150–400)
RBC: 3.86 MIL/uL — ABNORMAL LOW (ref 3.87–5.11)
RDW: 13.3 % (ref 11.5–15.5)
WBC: 15 10*3/uL — ABNORMAL HIGH (ref 4.0–10.5)
nRBC: 0 % (ref 0.0–0.2)

## 2020-06-25 LAB — RESPIRATORY PANEL BY RT PCR (FLU A&B, COVID)
Influenza A by PCR: NEGATIVE
Influenza B by PCR: NEGATIVE
SARS Coronavirus 2 by RT PCR: NEGATIVE

## 2020-06-25 LAB — COMPREHENSIVE METABOLIC PANEL
ALT: 13 U/L (ref 0–44)
AST: 14 U/L — ABNORMAL LOW (ref 15–41)
Albumin: 4.2 g/dL (ref 3.5–5.0)
Alkaline Phosphatase: 53 U/L (ref 38–126)
Anion gap: 11 (ref 5–15)
BUN: 11 mg/dL (ref 6–20)
CO2: 21 mmol/L — ABNORMAL LOW (ref 22–32)
Calcium: 8.7 mg/dL — ABNORMAL LOW (ref 8.9–10.3)
Chloride: 99 mmol/L (ref 98–111)
Creatinine, Ser: 0.73 mg/dL (ref 0.44–1.00)
GFR, Estimated: >60 mL/min (ref >60)
Glucose, Bld: 139 mg/dL — ABNORMAL HIGH (ref 70–99)
Potassium: 3.5 mmol/L (ref 3.5–5.1)
Sodium: 131 mmol/L — ABNORMAL LOW (ref 135–145)
Total Bilirubin: 0.6 mg/dL (ref 0.3–1.2)
Total Protein: 7.6 g/dL (ref 6.5–8.1)

## 2020-06-25 LAB — TROPONIN I (HIGH SENSITIVITY)
Troponin I (High Sensitivity): 3 ng/L (ref <18)
Troponin I (High Sensitivity): 7 ng/L (ref <18)

## 2020-06-25 LAB — LIPASE, BLOOD: Lipase: 19 U/L (ref 11–51)

## 2020-06-25 SURGERY — CHOLECYSTECTOMY, ROBOT-ASSISTED, LAPAROSCOPIC
Anesthesia: General

## 2020-06-25 MED ORDER — SODIUM CHLORIDE 0.9 % IV SOLN
INTRAVENOUS | Status: DC | PRN
  Administered 2020-06-25: 30 ug/min via INTRAVENOUS

## 2020-06-25 MED ORDER — ENOXAPARIN SODIUM 40 MG/0.4ML ~~LOC~~ SOLN
40.0000 mg | SUBCUTANEOUS | Status: DC
  Administered 2020-06-26: 40 mg via SUBCUTANEOUS
  Filled 2020-06-25: qty 0.4

## 2020-06-25 MED ORDER — PIPERACILLIN-TAZOBACTAM 3.375 G IVPB: 3.3750 g | Freq: Once | INTRAVENOUS | Status: DC

## 2020-06-25 MED ORDER — ONDANSETRON HCL 4 MG/2ML IJ SOLN
INTRAMUSCULAR | Status: AC
  Filled 2020-06-25: qty 2

## 2020-06-25 MED ORDER — ONDANSETRON 4 MG PO TBDP: 4.0000 mg | ORAL_TABLET | Freq: Four times a day (QID) | ORAL | Status: DC | PRN

## 2020-06-25 MED ORDER — DEXAMETHASONE SODIUM PHOSPHATE 10 MG/ML IJ SOLN
INTRAMUSCULAR | Status: AC
  Filled 2020-06-25: qty 1

## 2020-06-25 MED ORDER — FENTANYL CITRATE (PF) 100 MCG/2ML IJ SOLN
INTRAMUSCULAR | Status: DC | PRN
  Administered 2020-06-25 (×2): 50 ug via INTRAVENOUS
  Administered 2020-06-25: 25 ug via INTRAVENOUS

## 2020-06-25 MED ORDER — ONDANSETRON HCL 4 MG/2ML IJ SOLN
4.0000 mg | Freq: Once | INTRAMUSCULAR | Status: AC
  Administered 2020-06-25: 4 mg via INTRAVENOUS
  Filled 2020-06-25: qty 2

## 2020-06-25 MED ORDER — HYDROCODONE-ACETAMINOPHEN 5-325 MG PO TABS: 1.0000 | ORAL_TABLET | ORAL | Status: DC | PRN

## 2020-06-25 MED ORDER — FENTANYL CITRATE (PF) 100 MCG/2ML IJ SOLN
INTRAMUSCULAR | Status: AC
  Filled 2020-06-25: qty 2

## 2020-06-25 MED ORDER — BUPIVACAINE HCL (PF) 0.5 % IJ SOLN
INTRAMUSCULAR | Status: AC
  Filled 2020-06-25: qty 30

## 2020-06-25 MED ORDER — PROPOFOL 500 MG/50ML IV EMUL
INTRAVENOUS | Status: AC
  Filled 2020-06-25: qty 50

## 2020-06-25 MED ORDER — LIDOCAINE HCL (PF) 2 % IJ SOLN
INTRAMUSCULAR | Status: AC
  Filled 2020-06-25: qty 5

## 2020-06-25 MED ORDER — ACETAMINOPHEN 325 MG PO TABS: 650.0000 mg | ORAL_TABLET | Freq: Four times a day (QID) | ORAL | Status: DC | PRN

## 2020-06-25 MED ORDER — PROPOFOL 500 MG/50ML IV EMUL
INTRAVENOUS | Status: DC | PRN
  Administered 2020-06-25: 150 ug/kg/min via INTRAVENOUS

## 2020-06-25 MED ORDER — HYDROCODONE-ACETAMINOPHEN 5-325 MG PO TABS
1.0000 | ORAL_TABLET | ORAL | Status: DC | PRN
  Administered 2020-06-26: 2 via ORAL
  Filled 2020-06-25: qty 2

## 2020-06-25 MED ORDER — PROPOFOL 10 MG/ML IV BOLUS
INTRAVENOUS | Status: AC
  Filled 2020-06-25: qty 20

## 2020-06-25 MED ORDER — FENTANYL CITRATE (PF) 100 MCG/2ML IJ SOLN: 25.0000 ug | INTRAMUSCULAR | Status: DC | PRN

## 2020-06-25 MED ORDER — ONDANSETRON HCL 4 MG/2ML IJ SOLN: 4.0000 mg | Freq: Once | INTRAMUSCULAR | Status: DC | PRN

## 2020-06-25 MED ORDER — PIPERACILLIN-TAZOBACTAM 3.375 G IVPB
INTRAVENOUS | Status: AC
  Filled 2020-06-25: qty 50

## 2020-06-25 MED ORDER — MORPHINE SULFATE (PF) 2 MG/ML IV SOLN: 2.0000 mg | INTRAVENOUS | Status: DC | PRN

## 2020-06-25 MED ORDER — ONDANSETRON HCL 4 MG/2ML IJ SOLN
INTRAMUSCULAR | Status: DC | PRN
  Administered 2020-06-25: 4 mg via INTRAVENOUS

## 2020-06-25 MED ORDER — ROCURONIUM BROMIDE 100 MG/10ML IV SOLN
INTRAVENOUS | Status: DC | PRN
  Administered 2020-06-25: 50 mg via INTRAVENOUS
  Administered 2020-06-25: 20 mg via INTRAVENOUS

## 2020-06-25 MED ORDER — IBUPROFEN 400 MG PO TABS: 600.0000 mg | ORAL_TABLET | Freq: Four times a day (QID) | ORAL | Status: DC | PRN

## 2020-06-25 MED ORDER — DEXMEDETOMIDINE (PRECEDEX) IN NS 20 MCG/5ML (4 MCG/ML) IV SYRINGE
PREFILLED_SYRINGE | INTRAVENOUS | Status: DC | PRN
  Administered 2020-06-25: 8 ug via INTRAVENOUS
  Administered 2020-06-25 (×2): 4 ug via INTRAVENOUS
  Administered 2020-06-25: 8 ug via INTRAVENOUS
  Administered 2020-06-25: 4 ug via INTRAVENOUS

## 2020-06-25 MED ORDER — ACETAMINOPHEN 10 MG/ML IV SOLN
INTRAVENOUS | Status: AC
  Filled 2020-06-25: qty 100

## 2020-06-25 MED ORDER — INDOCYANINE GREEN 25 MG IV SOLR
1.2500 mg | Freq: Once | INTRAVENOUS | Status: AC
  Administered 2020-06-25: 1.25 mg via INTRAVENOUS
  Filled 2020-06-25: qty 0.5

## 2020-06-25 MED ORDER — DEXMEDETOMIDINE (PRECEDEX) IN NS 20 MCG/5ML (4 MCG/ML) IV SYRINGE
PREFILLED_SYRINGE | INTRAVENOUS | Status: AC
  Filled 2020-06-25: qty 10

## 2020-06-25 MED ORDER — TRAMADOL HCL 50 MG PO TABS: 50.0000 mg | ORAL_TABLET | Freq: Four times a day (QID) | ORAL | Status: DC | PRN

## 2020-06-25 MED ORDER — MORPHINE SULFATE (PF) 4 MG/ML IV SOLN
4.0000 mg | Freq: Once | INTRAVENOUS | Status: AC
  Administered 2020-06-25: 4 mg via INTRAVENOUS
  Filled 2020-06-25: qty 1

## 2020-06-25 MED ORDER — MORPHINE SULFATE (PF) 2 MG/ML IV SOLN: 1.0000 mg | INTRAVENOUS | Status: DC | PRN

## 2020-06-25 MED ORDER — BUPIVACAINE HCL (PF) 0.5 % IJ SOLN
INTRAMUSCULAR | Status: DC | PRN
  Administered 2020-06-25: 10 mL

## 2020-06-25 MED ORDER — LIDOCAINE-EPINEPHRINE (PF) 1 %-1:200000 IJ SOLN
INTRAMUSCULAR | Status: AC
  Filled 2020-06-25: qty 30

## 2020-06-25 MED ORDER — LIDOCAINE-EPINEPHRINE 1 %-1:100000 IJ SOLN
INTRAMUSCULAR | Status: AC
  Filled 2020-06-25: qty 1

## 2020-06-25 MED ORDER — LACTATED RINGERS IV SOLN: INTRAVENOUS | Status: DC | PRN

## 2020-06-25 MED ORDER — ONDANSETRON HCL 4 MG/2ML IJ SOLN: 4.0000 mg | Freq: Four times a day (QID) | INTRAMUSCULAR | Status: DC | PRN

## 2020-06-25 MED ORDER — ENOXAPARIN SODIUM 40 MG/0.4ML ~~LOC~~ SOLN: 40.0000 mg | SUBCUTANEOUS | Status: DC

## 2020-06-25 MED ORDER — MIDAZOLAM HCL 2 MG/2ML IJ SOLN
INTRAMUSCULAR | Status: AC
  Filled 2020-06-25: qty 2

## 2020-06-25 MED ORDER — SODIUM CHLORIDE 0.9 % IV SOLN
2.0000 g | INTRAVENOUS | Status: DC
  Filled 2020-06-25 (×2): qty 20

## 2020-06-25 MED ORDER — SUCCINYLCHOLINE CHLORIDE 200 MG/10ML IV SOSY
PREFILLED_SYRINGE | INTRAVENOUS | Status: AC
  Filled 2020-06-25: qty 10

## 2020-06-25 MED ORDER — LIDOCAINE-EPINEPHRINE (PF) 1 %-1:200000 IJ SOLN
INTRAMUSCULAR | Status: DC | PRN
  Administered 2020-06-25: 10 mL

## 2020-06-25 MED ORDER — PHENYLEPHRINE HCL (PRESSORS) 10 MG/ML IV SOLN
INTRAVENOUS | Status: DC | PRN
  Administered 2020-06-25 (×2): 100 ug via INTRAVENOUS
  Administered 2020-06-25: 150 ug via INTRAVENOUS

## 2020-06-25 MED ORDER — INFLUENZA VAC SPLIT QUAD 0.5 ML IM SUSY
0.5000 mL | PREFILLED_SYRINGE | INTRAMUSCULAR | Status: AC
  Administered 2020-06-26: 0.5 mL via INTRAMUSCULAR

## 2020-06-25 MED ORDER — LACTATED RINGERS IV SOLN
INTRAVENOUS | Status: DC
Start: 2020-06-25 — End: 2020-06-26

## 2020-06-25 MED ORDER — PROPOFOL 10 MG/ML IV BOLUS
INTRAVENOUS | Status: DC | PRN
  Administered 2020-06-25: 170 mg via INTRAVENOUS

## 2020-06-25 MED ORDER — SUGAMMADEX SODIUM 200 MG/2ML IV SOLN
INTRAVENOUS | Status: DC | PRN
  Administered 2020-06-25: 200 mg via INTRAVENOUS

## 2020-06-25 MED ORDER — ROCURONIUM BROMIDE 10 MG/ML (PF) SYRINGE
PREFILLED_SYRINGE | INTRAVENOUS | Status: AC
  Filled 2020-06-25: qty 10

## 2020-06-25 MED ORDER — ACETAMINOPHEN 10 MG/ML IV SOLN
INTRAVENOUS | Status: DC | PRN
  Administered 2020-06-25: 1000 mg via INTRAVENOUS

## 2020-06-25 MED ORDER — DEXAMETHASONE SODIUM PHOSPHATE 10 MG/ML IJ SOLN
INTRAMUSCULAR | Status: DC | PRN
  Administered 2020-06-25: 6 mg via INTRAVENOUS

## 2020-06-25 MED ORDER — MIDAZOLAM HCL 2 MG/2ML IJ SOLN
INTRAMUSCULAR | Status: DC | PRN
  Administered 2020-06-25: 2 mg via INTRAVENOUS

## 2020-06-25 MED ORDER — DOCUSATE SODIUM 100 MG PO CAPS: 100.0000 mg | ORAL_CAPSULE | Freq: Two times a day (BID) | ORAL | Status: DC | PRN

## 2020-06-25 MED ORDER — LIDOCAINE HCL (CARDIAC) PF 100 MG/5ML IV SOSY
PREFILLED_SYRINGE | INTRAVENOUS | Status: DC | PRN
  Administered 2020-06-25: 100 mg via INTRAVENOUS
  Administered 2020-06-25: 40 mg via INTRAVENOUS

## 2020-06-25 MED ORDER — SODIUM CHLORIDE 0.9 % IV SOLN
1000.0000 mL | Freq: Once | INTRAVENOUS | Status: AC
  Administered 2020-06-25: 1000 mL via INTRAVENOUS

## 2020-06-25 SURGICAL SUPPLY — 58 items
ANCHOR TIS RET SYS 235ML (MISCELLANEOUS) ×3 IMPLANT
BAG INFUSER PRESSURE 100CC (MISCELLANEOUS) IMPLANT
BLADE SURG SZ11 CARB STEEL (BLADE) ×3 IMPLANT
CANISTER SUCT 1200ML W/VALVE (MISCELLANEOUS) ×3 IMPLANT
CANNULA REDUC XI 12-8 STAPL (CANNULA) ×1
CANNULA REDUC XI 12-8MM STAPL (CANNULA) ×1
CANNULA REDUCER 12-8 DVNC XI (CANNULA) ×1 IMPLANT
CATH REDDICK CHOLANGI 4FR 50CM (CATHETERS) IMPLANT
CHLORAPREP W/TINT 26 (MISCELLANEOUS) ×3 IMPLANT
CLIP VESOLOCK MED LG 6/CT (CLIP) ×3 IMPLANT
COVER TIP SHEARS 8 DVNC (MISCELLANEOUS) ×1 IMPLANT
COVER TIP SHEARS 8MM DA VINCI (MISCELLANEOUS) ×2
COVER WAND RF STERILE (DRAPES) ×3 IMPLANT
DECANTER SPIKE VIAL GLASS SM (MISCELLANEOUS) ×6 IMPLANT
DEFOGGER SCOPE WARMER CLEARIFY (MISCELLANEOUS) ×3 IMPLANT
DERMABOND ADVANCED (GAUZE/BANDAGES/DRESSINGS) ×2
DERMABOND ADVANCED .7 DNX12 (GAUZE/BANDAGES/DRESSINGS) ×1 IMPLANT
DRAPE ARM DVNC X/XI (DISPOSABLE) ×4 IMPLANT
DRAPE C-ARM XRAY 36X54 (DRAPES) IMPLANT
DRAPE COLUMN DVNC XI (DISPOSABLE) ×1 IMPLANT
DRAPE DA VINCI XI ARM (DISPOSABLE) ×8
DRAPE DA VINCI XI COLUMN (DISPOSABLE) ×2
ELECT CAUTERY BLADE 6.4 (BLADE) ×3 IMPLANT
ELECT REM PT RETURN 9FT ADLT (ELECTROSURGICAL) ×3
ELECTRODE REM PT RTRN 9FT ADLT (ELECTROSURGICAL) ×1 IMPLANT
GLOVE BIOGEL PI IND STRL 7.0 (GLOVE) ×2 IMPLANT
GLOVE BIOGEL PI INDICATOR 7.0 (GLOVE) ×4
GLOVE SURG SYN 6.5 ES PF (GLOVE) ×6 IMPLANT
GOWN STRL REUS W/ TWL LRG LVL3 (GOWN DISPOSABLE) ×3 IMPLANT
GOWN STRL REUS W/TWL LRG LVL3 (GOWN DISPOSABLE) ×6
GRASPER SUT TROCAR 14GX15 (MISCELLANEOUS) IMPLANT
IRRIGATOR SUCT 8 DISP DVNC XI (IRRIGATION / IRRIGATOR) ×1 IMPLANT
IRRIGATOR SUCTION 8MM XI DISP (IRRIGATION / IRRIGATOR) ×2
IV NS 1000ML (IV SOLUTION)
IV NS 1000ML BAXH (IV SOLUTION) IMPLANT
LABEL OR SOLS (LABEL) ×3 IMPLANT
MANIFOLD NEPTUNE II (INSTRUMENTS) ×3 IMPLANT
NEEDLE HYPO 22GX1.5 SAFETY (NEEDLE) ×3 IMPLANT
NEEDLE INSUFFLATION 14GA 120MM (NEEDLE) ×3 IMPLANT
NS IRRIG 500ML POUR BTL (IV SOLUTION) ×3 IMPLANT
OBTURATOR OPTICAL STANDARD 8MM (TROCAR) ×2
OBTURATOR OPTICAL STND 8 DVNC (TROCAR) ×1
OBTURATOR OPTICALSTD 8 DVNC (TROCAR) ×1 IMPLANT
PACK LAP CHOLECYSTECTOMY (MISCELLANEOUS) ×3 IMPLANT
PENCIL ELECTRO HAND CTR (MISCELLANEOUS) ×3 IMPLANT
SEAL CANN UNIV 5-8 DVNC XI (MISCELLANEOUS) ×3 IMPLANT
SEAL XI 5MM-8MM UNIVERSAL (MISCELLANEOUS) ×6
SET TUBE SMOKE EVAC HIGH FLOW (TUBING) ×3 IMPLANT
SOLUTION ELECTROLUBE (MISCELLANEOUS) ×3 IMPLANT
STAPLER CANNULA SEAL DVNC XI (STAPLE) ×1 IMPLANT
STAPLER CANNULA SEAL XI (STAPLE) ×2
SUT MNCRL 4-0 (SUTURE) ×4
SUT MNCRL 4-0 27XMFL (SUTURE) ×2
SUT VIC AB 0 CT2 27 (SUTURE) ×3 IMPLANT
SUT VICRYL 0 AB UR-6 (SUTURE) ×3 IMPLANT
SUTURE MNCRL 4-0 27XMF (SUTURE) ×2 IMPLANT
SYR 30ML LL (SYRINGE) IMPLANT
SYSTEM WECK SHIELD CLOSURE (TROCAR) ×3 IMPLANT

## 2020-06-25 NOTE — Progress Notes (Signed)
PHARMACY NOTE:  ANTIMICROBIAL INDICATION DOSAGE ADJUSTMENT  Current antimicrobial regimen includes a mismatch between antimicrobial dosage and estimated renal function.  As per policy approved by the Pharmacy & Therapeutics and Medical Executive Committees, the antimicrobial dosage will be adjusted accordingly.  Current antimicrobial dosage:  Rocephin 1gm q24hrs   Indication: intra-abdomnial infection  Renal Function:  Estimated Creatinine Clearance: 76.2 mL/min (by C-G formula based on SCr of 0.73 mg/dL). []      On intermittent HD, scheduled: []      On CRRT    Antimicrobial dosage has been changed to:  Rocephin 2mg  q24hrs Based on  Indication for intra-abdominal infection. Additional comments:   Thank you for allowing pharmacy to be a part of this patient's care.  , [facility] 06/25/2020 12:50 PM

## 2020-06-25 NOTE — Transfer of Care (Signed)
Immediate Anesthesia Transfer of Care Note  Patient: [name]  Procedure(s) Performed: XI ROBOTIC ASSISTED LAPAROSCOPIC CHOLECYSTECTOMY (N/A ) INDOCYANINE GREEN FLUORESCENCE IMAGING (ICG) (N/A )  Patient Location: PACU  Anesthesia Type:General  Level of Consciousness: drowsy and patient cooperative  Airway & Oxygen Therapy: Patient Spontanous Breathing and Patient connected to face mask oxygen  Post-op Assessment: Report given to RN and Post -op Vital signs reviewed and stable  Post vital signs: Reviewed and stable  Last Vitals:  Vitals Value Taken Time  BP 112/64 06/25/20 1919  Temp    Pulse 83 06/25/20 1921  Resp 22 06/25/20 1921  SpO2 100 % 06/25/20 1921  Vitals shown include unvalidated device data.  Last Pain:  Vitals:   06/25/20 1653  TempSrc: Oral  PainSc: 0-No pain         Complications: No complications documented.

## 2020-06-25 NOTE — ED Triage Notes (Signed)
Pt to triage via w/c with no distress noted; pt reports mid epigastric pain radiating thru to back accomp by N/V since midnight; denies hx of same

## 2020-06-25 NOTE — H&P (Signed)
Subjective:   CC: acute cholecystitis  HPI:  [name] is a 44 y.o. female who is consulted by Cyril Loosen for evaluation of above cc.  Symptoms were first noted 2 days ago. Pain is sharp  Associated with nausea, exacerbated by nothing specific     Past Medical History:  has a past medical history of Endometriosis, Hyperlipidemia, and IBS (irritable bowel syndrome).  Past Surgical History:  has a past surgical history that includes Exploratory laparotomy.  Family History: family history includes Cancer in her paternal grandmother; Coronary artery disease in her father; Diabetes in her father; Hyperlipidemia in her father; Hypertension in her father, maternal grandmother, and mother.  Social History:  reports that she has quit smoking. She has never used smokeless tobacco. She reports that she does not drink alcohol and does not use drugs.  Current Medications:  Prior to Admission medications   Not on File    Allergies:  Allergies as of 06/25/2020 - Review Complete 06/25/2020  Allergen Reaction Noted  . Fluoxetine Other (See Comments) 03/12/2015  . Prednisone Palpitations 03/12/2015    ROS:  General: Denies weight loss, weight gain, fatigue, fevers, chills, and night sweats. Eyes: Denies blurry vision, double vision, eye pain, itchy eyes, and tearing. Ears: Denies hearing loss, earache, and ringing in ears. Nose: Denies sinus pain, congestion, infections, runny nose, and nosebleeds. Mouth/throat: Denies hoarseness, sore throat, bleeding gums, and difficulty swallowing. Heart: Denies chest pain, palpitations, racing heart, irregular heartbeat, leg pain or swelling, and decreased activity tolerance. Respiratory: Denies breathing difficulty, shortness of breath, wheezing, cough, and sputum. GI: Denies change in appetite, heartburn, vomiting, constipation, diarrhea, and blood in stool. GU: Denies difficulty urinating, pain with urinating, urgency, frequency, blood in  urine. Musculoskeletal: Denies joint stiffness, pain, swelling, muscle weakness. Skin: Denies rash, itching, mass, tumors, sores, and boils Neurologic: Denies headache, fainting, dizziness, seizures, numbness, and tingling. Psychiatric: Denies depression, anxiety, difficulty sleeping, and memory loss. Endocrine: Denies heat or cold intolerance, and increased thirst or urination. Blood/lymph: Denies easy bruising, and swollen glands     Objective:     BP 138/81   Pulse 82   Temp 98.2 F (36.8 C) (Oral)   Resp (!) 21   Ht 5' (1.524 m)   Wt 63.5 kg   LMP 06/11/2020 (Exact Date)   SpO2 98%   BMI 27.34 kg/m    Constitutional :  alert, cooperative, appears stated age and no distress  Lymphatics/Throat:  no asymmetry, masses, or scars  Respiratory:  clear to auscultation bilaterally  Cardiovascular:  regular rate and rhythm  Gastrointestinal: soft, non-tender; bowel sounds normal; no masses,  no organomegaly.   Musculoskeletal: Steady movement  Skin: Cool and moist  Psychiatric: Normal affect, non-agitated, not confused       LABS:  CMP Latest Ref Rng & Units 06/25/2020 07/31/2008  Glucose 70 - 99 mg/dL 009(F) 90  BUN 6 - 20 mg/dL 11 9  Creatinine 8.18 - 1.00 mg/dL 2.99 0.7  Sodium 371 - 145 mmol/L 131(L) 138  Potassium 3.5 - 5.1 mmol/L 3.5 4.0  Chloride 98 - 111 mmol/L 99 106  CO2 22 - 32 mmol/L 21(L) 26  Calcium 8.9 - 10.3 mg/dL 6.9(C) 8.8  Total Protein 6.5 - 8.1 g/dL 7.6 7.0  Total Bilirubin 0.3 - 1.2 mg/dL 0.6 0.4  Alkaline Phos 38 - 126 U/L 53 46  AST 15 - 41 U/L 14(L) 13  ALT 0 - 44 U/L 13 16   CBC Latest Ref Rng & Units  06/25/2020 07/05/2010 05/05/2010  WBC 4.0 - 10.5 K/uL 15.0(H) 7.8 9.5  Hemoglobin 12.0 - 15.0 g/dL 11.8(L) 12.4 12.7  Hematocrit 36 - 46 % 34.5(L) 37.0 39.7  Platelets 150 - 400 K/uL 291 256 264     RADS: CLINICAL DATA:  Upper abdominal pain.  EXAM: ULTRASOUND ABDOMEN LIMITED RIGHT UPPER QUADRANT  COMPARISON:  Abdominal ultrasound  03/07/2017  FINDINGS: Gallbladder:  Gallbladder is distended. There are multiple shadowing stones measuring up to 0.9 cm. There is mild gallbladder wall thickening measuring 0.4 cm and possible trace pericholecystic fluid. Negative sonographic Murphy sign.  Common bile duct:  Diameter: 0.2 cm.  Liver:  No focal lesion identified. Within normal limits in parenchymal echogenicity. Portal vein is patent on color Doppler imaging with normal direction of blood flow towards the liver.  Other: None.  IMPRESSION: Distended gallbladder with mild wall thickening and gallstones. Possible trace pericholecystic fluid. Negative sonographic Murphy sign. Findings are technically indeterminate though raise the possibility of early acute cholecystitis in the appropriate clinical setting.   Electronically Signed   By: Emmaline Kluver M.D.   On: 06/25/2020 09:27 Assessment:      Acute cholecystitis, history and pain location consistent with worrisome [place] results as noted above  Plan:      Discussed the risk of surgery including post-op infxn, seroma, biloma, chronic pain, poor-delayed wound healing, retained gallstone, conversion to open procedure, post-op SBO or ileus, and need for additional procedures to address said risks.  The risks of general anesthetic including MI, CVA, sudden death or even reaction to anesthetic medications also discussed. Alternatives include continued observation.  Benefits include possible symptom relief, prevention of complications including acute cholecystitis, pancreatitis.  Typical post operative recovery of 3-5 days rest, continued pain in area and incision sites, possible loose stools up to 4-6 weeks, also discussed.  The patient understands the risks, any and all questions were answered to the patient's satisfaction.  To OR for robo lap chole

## 2020-06-25 NOTE — ED Provider Notes (Signed)
[facility] Emergency Department Provider Note   ____________________________________________    I have reviewed the triage vital signs and the nursing notes.   HISTORY  Chief Complaint Abdominal Pain     HPI [name] is a 43 y.o. female with a history of endometriosis, IBS who presents with epigastric abdominal pain which started last night around midnight.  She reports he was not feeling good prior to that.  Has never had similar symptoms before.  No history of abdominal surgery besides laparoscopically for endometriosis diagnosis.  No fevers chills, some nausea.  Has not take anything for this.  Past Medical History:  Diagnosis Date   Endometriosis    Hyperlipidemia    IBS (irritable bowel syndrome)     Patient Active Problem List   Diagnosis Date Noted   Ventral hernia 04/19/2011   HYPERLIPIDEMIA 12/17/2007   IBS 12/17/2007   ENDOMETRIOSIS, SITE UNSPECIFIED 12/17/2007   SCOLIOSIS 12/17/2007   FATIGUE 12/17/2007    Past Surgical History:  Procedure Laterality Date   EXPLORATORY LAPAROTOMY     endometriosis    Prior to Admission medications   Medication Sig Start Date End Date Taking? Authorizing Provider  Prenatal Vit-Fe Sulfate-FA (PRENATAL VITAMIN PO) Take 1 tablet by mouth daily.      [provider]  valACYclovir (VALTREX) 1000 MG tablet  04/15/11   [provider]  vitamin C (ASCORBIC ACID) 500 MG tablet Take 500 mg by mouth daily.      [provider]  ZOVIRAX 5 % ointment  03/25/11   [provider]     Allergies Prednisone  Family History  Problem Relation Age of Onset   Hypertension Mother    Diabetes Father    Hyperlipidemia Father    Hypertension Father    Coronary artery disease Father    Hypertension Maternal Grandmother    Cancer Paternal Grandmother        melonoma    Social History Social History   Tobacco Use   Smoking status: Former  Smoker   Smokeless tobacco: Never Used  Substance Use Topics   Alcohol use: No   Drug use: No    Review of Systems  Constitutional: No fever/chills Eyes: No visual changes.  ENT: No sore throat. Cardiovascular: Denies chest pain. Respiratory: Denies shortness of breath. Gastrointestinal: As above Genitourinary: Negative for dysuria. Musculoskeletal: Negative for back pain. Skin: Negative for rash. Neurological: Negative for headaches or weakness   ____________________________________________   PHYSICAL EXAM:  VITAL SIGNS: ED Triage Vitals  Enc Vitals Group     BP 06/25/20 0538 (!) 143/74     Pulse Rate 06/25/20 0538 82     Resp 06/25/20 0538 18     Temp 06/25/20 0538 98.2 F (36.8 C)     Temp Source 06/25/20 0538 Oral     SpO2 06/25/20 0538 100 %     Weight 06/25/20 0538 63.5 kg (140 lb)     Height 06/25/20 0538 1.524 m (5')     Head Circumference --      Peak Flow --      Pain Score 06/25/20 0537 9     Pain Loc --      Pain Edu? --      Excl. in GC? --     Constitutional: Alert and oriented.  Nose: No congestion/rhinnorhea. Mouth/Throat: Mucous membranes are moist.    Cardiovascular: Normal rate, regular rhythm. Grossly normal heart sounds.  Good peripheral circulation. Respiratory: Normal  respiratory effort.  No retractions. Lungs CTAB. Gastrointestinal: Mild tenderness in the epigastrium and right upper quadrant. No distention.  No CVA tenderness. Musculoskeletal:   Warm and well perfused Neurologic:  Normal speech and language. No gross focal neurologic deficits are appreciated.  Skin:  Skin is warm, dry and intact. No rash noted. Psychiatric: Mood and affect are normal. Speech and behavior are normal.  ____________________________________________   LABS (all labs ordered are listed, but only abnormal results are displayed)  Labs Reviewed  CBC WITH DIFFERENTIAL/PLATELET - Abnormal; Notable for the following components:      Result Value   WBC  15.0 (*)    RBC 3.86 (*)    Hemoglobin 11.8 (*)    HCT 34.5 (*)    Neutro Abs 12.8 (*)    All other components within normal limits  COMPREHENSIVE METABOLIC PANEL - Abnormal; Notable for the following components:   Sodium 131 (*)    CO2 21 (*)    Glucose, Bld 139 (*)    Calcium 8.7 (*)    AST 14 (*)    All other components within normal limits  RESPIRATORY PANEL BY RT PCR (FLU A&B, COVID)  LIPASE, BLOOD  TROPONIN I (HIGH SENSITIVITY)  TROPONIN I (HIGH SENSITIVITY)   ____________________________________________  EKG  ED ECG REPORT I, Jene Every, the attending physician, personally viewed and interpreted this ECG.  Date: 06/25/2020  Rhythm: normal sinus rhythm QRS Axis: normal Intervals: normal ST/T Wave abnormalities: normal Narrative Interpretation: no evidence of acute ischemia  ____________________________________________  RADIOLOGY  Ultrasound reviewed by me, string of gallstones noted, possible mild thickening of gallbladder wall, pending radiology read ____________________________________________   PROCEDURES  Procedure(s) performed: No  Procedures   Critical Care performed: No ____________________________________________   INITIAL IMPRESSION / ASSESSMENT AND PLAN / ED COURSE  Pertinent labs & imaging results that were available during my care of the patient were reviewed by me and considered in my medical decision making (see chart for details).  Patient presents with epigastric abdominal pain that started last night and has been continuous.  Differential includes cholelithiasis, gastritis/PUD, pancreatitis  Lab work notable for elevated white blood cell count.  LFTs normal.  Lipase normal.  Patient's ultrasound performed, concerning for possible early cholecystitis, surgery consulted.  ----------------------------------------- 10:24 AM on 06/25/2020 ----------------------------------------- Patient complained briefly of chest discomfort,  EKG performed, unremarkable  ED ECG REPORT I, Jene Every, the attending physician, personally viewed and interpreted this ECG.  Date: 06/25/2020 Rhythm: normal sinus rhythm QRS Axis: normal Intervals: normal ST/T Wave abnormalities: normal Narrative Interpretation: no evidence of acute ischemia   ----------------------------------------- 12:43 PM on 06/25/2020 ----------------------------------------- Patient seen by Dr. Tonna Boehringer of surgery, he will admit for surgery      ____________________________________________   FINAL CLINICAL IMPRESSION(S) / ED DIAGNOSES  Final diagnoses:  Upper abdominal pain  Cholecystitis        Note:  This document was prepared using Dragon voice recognition software and may include unintentional dictation errors.   Jene Every, MD 06/25/20 1243

## 2020-06-25 NOTE — Anesthesia Preprocedure Evaluation (Signed)
Anesthesia Evaluation  Patient identified by MRN, date of birth, ID band Patient awake    Reviewed: Allergy & Precautions, NPO status , Patient's Chart, lab work & pertinent test results  History of Anesthesia Complications (+) Family history of anesthesia reaction and history of anesthetic complications (Father and paternal grandmother with MH)  Airway Mallampati: II       Dental   Pulmonary neg sleep apnea, neg COPD, Not current smoker, former smoker,           Cardiovascular (-) hypertension(-) Past MI and (-) CHF (-) dysrhythmias (-) Valvular Problems/Murmurs     Neuro/Psych neg Seizures    GI/Hepatic Neg liver ROS, GERD (with Gall bladder issues)  ,  Endo/Other  neg diabetes  Renal/GU negative Renal ROS     Musculoskeletal   Abdominal   Peds  Hematology   Anesthesia Other Findings   Reproductive/Obstetrics                             Anesthesia Physical Anesthesia Plan  ASA: II  Anesthesia Plan: General   Post-op Pain Management:    Induction:   PONV Risk Score and Plan: 3 and Ondansetron and Dexamethasone  Airway Management Planned: Oral ETT  Additional Equipment:   Intra-op Plan:   Post-operative Plan:   Informed Consent: I have reviewed the patients History and Physical, chart, labs and discussed the procedure including the risks, benefits and alternatives for the proposed anesthesia with the patient or authorized representative who has indicated his/her understanding and acceptance.       Plan Discussed with:   Anesthesia Plan Comments:         Anesthesia Quick Evaluation

## 2020-06-25 NOTE — Op Note (Signed)
Preoperative diagnosis:  acute and cholecystitis  Postoperative diagnosis: same as above  Procedure: Robotic assisted Laparoscopic Cholecystectomy.   Anesthesia: GETA   Surgeon: Sung Amabile  Specimen: Gallbladder  Complications: None  EBL: 25mL  Wound Classification: Clean Contaminated  Indications: see HPI  Findings: Critical view of safety noted Cystic duct and artery identified, ligated and divided, clips remained intact at end of procedure Adequate hemostasis  Description of procedure:  The patient was placed on the operating table in the supine position. SCDs placed, pre-op abx administered.  General anesthesia was induced and OG tube placed by anesthesia. A time-out was completed verifying correct patient, procedure, site, positioning, and implant(s) and/or special equipment prior to beginning this procedure. The abdomen was prepped and draped in the usual sterile fashion.    Veress needle was placed at the Palmer's point and insufflation was started after confirming a positive saline drop test and no immediate increase in abdominal pressure.  After reaching 15 mm, the Veress needle was removed and a 8 mm port was placed via optiview technique under umbilicus measured 27mm from gallbladder.  The abdomen was inspected and no abnormalities or injuries were found.  Under direct vision, ports were placed in the following locations: One 12 mm patient left of the umbilicus, 8cm from the optiviewed port, one 8 mm port placed to the patient right of the umbilical port 8 cm apart.  1 additional 8 mm port placed lateral to the 14mm port.  Once ports were placed, The table was placed in the reverse Trendelenburg position with the right side up. The Xi platform was brought into the operative field and docked to the ports successfully.  An endoscope was placed through the umbilical port, fenestrated grasper through the adjacent patient right port, prograsp to the far patient left port, and then  a hook cautery in the left port.  The dome of the gallbladder was grasped with prograsp, passed and retracted over the dome of the liver. Adhesions between the gallbladder and omentum, duodenum and transverse colon were lysed via hook cautery. The infundibulum was grasped with the fenestrated grasper and retracted toward the right lower quadrant. This maneuver exposed Calot's triangle. The peritoneum overlying the gallbladder infundibulum was then dissected using combination of [place] dissector and electrocautery hook and the cystic duct and cystic artery identified.  Critical view of safety with the liver bed clearly visible behind the duct and artery with no additional structures noted.  The cystic duct and cystic artery clipped and divided close to the gallbladder.     The gallbladder was then dissected from its peritoneal and liver bed attachments by electrocautery. Hemostasis was checked prior to removing the hook cautery and the Endo Catch bag was then placed through the 12 mm port and the gallbladder was removed.  The gallbladder was passed off the table as a specimen. There was no evidence of bleeding from the gallbladder fossa or cystic artery or leakage of the bile from the cystic duct stump. All spilled bile during the dissection portion suctioned out. The 12 mm port site closed with PMI using 0 vicryl under direct vision.  Abdomen desufflated and secondary trocars were removed under direct vision. No bleeding was noted. All skin incisions then closed with subcuticular sutures of 4-0 monocryl and dressed with topical skin adhesive. The orogastric tube was removed and patient extubated.  The patient tolerated the procedure well and was taken to the postanesthesia care unit in stable condition.  All sponge and instrument count  correct at end of procedure.

## 2020-06-25 NOTE — Anesthesia Postprocedure Evaluation (Signed)
Anesthesia Post Note  Patient: [name]  Procedure(s) Performed: XI ROBOTIC ASSISTED LAPAROSCOPIC CHOLECYSTECTOMY (N/A ) INDOCYANINE GREEN FLUORESCENCE IMAGING (ICG) (N/A )  Patient location during evaluation: PACU Anesthesia Type: General Level of consciousness: awake and alert Pain management: pain level controlled Vital Signs Assessment: post-procedure vital signs reviewed and stable Respiratory status: spontaneous breathing and respiratory function stable Cardiovascular status: stable Anesthetic complications: no   No complications documented.   Last Vitals:  Vitals:   06/25/20 2020 06/25/20 2025  BP: 114/72   Pulse: 73 60  Resp: 17 18  Temp:    SpO2: 100% 100%    Last Pain:  Vitals:   06/25/20 2005  TempSrc:   PainSc: 0-No pain                 [name] K

## 2020-06-25 NOTE — Interval H&P Note (Signed)
History and Physical Interval Note:  06/25/2020 5:07 PM  [name]  has presented today for surgery, with the diagnosis of acute cholelithasis.  The various methods of treatment have been discussed with the patient and family. After consideration of risks, benefits and other options for treatment, the patient has consented to  Procedure(s): XI ROBOTIC ASSISTED LAPAROSCOPIC CHOLECYSTECTOMY (N/A) INDOCYANINE GREEN FLUORESCENCE IMAGING (ICG) (N/A) as a surgical intervention.  The patient's history has been reviewed, patient examined, no change in status, stable for surgery.  I have reviewed the patient's chart and labs.  Questions were answered to the patient's satisfaction.     Donnavan Covault Tonna Boehringer

## 2020-06-25 NOTE — Anesthesia Procedure Notes (Signed)
Procedure Name: Intubation Date/Time: 06/25/2020 5:28 PM Performed by: Elmarie Mainland, CRNA Pre-anesthesia Checklist: Patient identified, Emergency Drugs available, Suction available and Patient being monitored Patient Re-evaluated:Patient Re-evaluated prior to induction Oxygen Delivery Method: Circle system utilized Preoxygenation: Pre-oxygenation with 100% oxygen Induction Type: IV induction Ventilation: Mask ventilation without difficulty Laryngoscope Size: Glidescope and 3 Grade View: Grade I Tube type: Oral Tube size: 7.0 mm Number of attempts: 1 Airway Equipment and Method: Stylet and Video-laryngoscopy Placement Confirmation: ETT inserted through vocal cords under direct vision,  positive ETCO2 and breath sounds checked- equal and bilateral Secured at: 22 cm Tube secured with: Tape Dental Injury: Teeth and Oropharynx as per pre-operative assessment

## 2020-06-26 LAB — BASIC METABOLIC PANEL
Anion gap: 8 (ref 5–15)
BUN: 7 mg/dL (ref 6–20)
CO2: 21 mmol/L — ABNORMAL LOW (ref 22–32)
Calcium: 8.3 mg/dL — ABNORMAL LOW (ref 8.9–10.3)
Chloride: 106 mmol/L (ref 98–111)
Creatinine, Ser: 0.65 mg/dL (ref 0.44–1.00)
GFR, Estimated: >60 mL/min (ref >60)
Glucose, Bld: 124 mg/dL — ABNORMAL HIGH (ref 70–99)
Potassium: 3.8 mmol/L (ref 3.5–5.1)
Sodium: 135 mmol/L (ref 135–145)

## 2020-06-26 LAB — HIV ANTIBODY (ROUTINE TESTING W REFLEX): HIV Screen 4th Generation wRfx: NONREACTIVE

## 2020-06-26 LAB — CBC
HCT: 33.3 % — ABNORMAL LOW (ref 36.0–46.0)
Hemoglobin: 11.3 g/dL — ABNORMAL LOW (ref 12.0–15.0)
MCH: 29.9 pg (ref 26.0–34.0)
MCHC: 33.9 g/dL (ref 30.0–36.0)
MCV: 88.1 fL (ref 80.0–100.0)
Platelets: 243 10*3/uL (ref 150–400)
RBC: 3.78 MIL/uL — ABNORMAL LOW (ref 3.87–5.11)
RDW: 13.7 % (ref 11.5–15.5)
WBC: 8.9 10*3/uL (ref 4.0–10.5)
nRBC: 0 % (ref 0.0–0.2)

## 2020-06-26 SURGERY — CORONARY/GRAFT ACUTE MI REVASCULARIZATION
Anesthesia: LOCAL

## 2020-06-26 MED ORDER — AMOXICILLIN-POT CLAVULANATE 875-125 MG PO TABS
1.0000 | ORAL_TABLET | Freq: Two times a day (BID) | ORAL | 0 refills | Status: DC
Start: 2020-06-26 — End: 2020-06-26

## 2020-06-26 MED ORDER — IBUPROFEN 800 MG PO TABS: 800.0000 mg | ORAL_TABLET | Freq: Three times a day (TID) | ORAL | 0 refills | Status: DC | PRN

## 2020-06-26 MED ORDER — DOCUSATE SODIUM 100 MG PO CAPS: 100.0000 mg | ORAL_CAPSULE | Freq: Two times a day (BID) | ORAL | 0 refills | Status: AC | PRN

## 2020-06-26 MED ORDER — ACETAMINOPHEN 325 MG PO TABS: 650.0000 mg | ORAL_TABLET | Freq: Three times a day (TID) | ORAL | 0 refills | Status: AC | PRN

## 2020-06-26 MED ORDER — HYDROCODONE-ACETAMINOPHEN 5-325 MG PO TABS: 1.0000 | ORAL_TABLET | Freq: Four times a day (QID) | ORAL | 0 refills | Status: DC | PRN

## 2020-06-26 MED ORDER — DOCUSATE SODIUM 100 MG PO CAPS: 100.0000 mg | ORAL_CAPSULE | Freq: Two times a day (BID) | ORAL | 0 refills | Status: DC | PRN

## 2020-06-26 MED ORDER — AMOXICILLIN-POT CLAVULANATE 875-125 MG PO TABS
1.0000 | ORAL_TABLET | Freq: Two times a day (BID) | ORAL | 0 refills | Status: AC
Start: 2020-06-26 — End: 2020-07-03

## 2020-06-26 MED ORDER — HYDROCODONE-ACETAMINOPHEN 5-325 MG PO TABS: 1.0000 | ORAL_TABLET | Freq: Four times a day (QID) | ORAL | 0 refills | Status: AC | PRN

## 2020-06-26 MED ORDER — ACETAMINOPHEN 325 MG PO TABS: 650.0000 mg | ORAL_TABLET | Freq: Three times a day (TID) | ORAL | 0 refills | Status: DC | PRN

## 2020-06-26 NOTE — Plan of Care (Signed)
Discharge teaching completed with patient who is in stable condition. 

## 2020-06-26 NOTE — Discharge Summary (Signed)
Physician Discharge Summary  Patient ID: [name] MRN: [id number] DOB/AGE: [birth year] 42 y.o.  Admit date: 06/25/2020 Discharge date: 06/26/20  Admission Diagnoses: acute cholecystitis  Discharge Diagnoses:  Same as above  Discharged Condition: good  Hospital Course: admitted for above.  underwetnt robo lap chole.  See op note.  Recovered as expected.  Ready for d/c.  abx at home due to spillage intraop  Consults: None  Discharge Exam: Blood pressure 121/74, pulse 62, temperature 97.9 F (36.6 C), temperature source Oral, resp. rate 16, height 5' (1.524 m), weight 63.4 kg, last menstrual period 06/11/2020, SpO2 98 %. General appearance: alert, cooperative and no distress GI: soft, non-tender; bowel sounds normal; no masses,  no organomegaly and incisions, c/d/i  Disposition:  Discharge disposition: 01-Home or Self Care       Discharge Instructions    Discharge patient   Complete by: As directed    Discharge disposition: 01-Home or Self Care   Discharge patient date: 06/26/2020     Allergies as of 06/26/2020      Reactions   Fluoxetine Other (See Comments)   Break with reality. Break with reality.   Prednisone Palpitations   REACTION: Rapid heart rate, face flushed REACTION: Rapid heart rate, face flushed REACTION: Rapid heart rate, face flushed REACTION: Rapid heart rate, face flushed      Medication List    TAKE these medications   acetaminophen 325 MG tablet Commonly known as: Tylenol Take 2 tablets (650 mg total) by mouth every 8 (eight) hours as needed for mild pain.   amoxicillin-clavulanate 875-125 MG tablet Commonly known as: Augmentin Take 1 tablet by mouth 2 (two) times daily for 7 days.   docusate sodium 100 MG capsule Commonly known as: Colace Take 1 capsule (100 mg total) by mouth 2 (two) times daily as needed for up to 10 days for mild constipation.   HYDROcodone-acetaminophen 5-325 MG tablet Commonly known as: Norco Take 1  tablet by mouth every 6 (six) hours as needed for up to 6 doses for moderate pain.   ibuprofen 800 MG tablet Commonly known as: ADVIL Take 1 tablet (800 mg total) by mouth every 8 (eight) hours as needed for mild pain or moderate pain.       Follow-up Information    Tonna Boehringer, Keishawn Rajewski, DO Follow up in 2 week(s).   Specialty: Surgery Why: post op lap chole Contact information: [address] [place] [address] [phone]                Total time spent arranging discharge was >50min. Signed: Sung Amabile 06/26/2020, 1:14 PM

## 2020-06-26 NOTE — Discharge Instructions (Signed)
Laparoscopic Cholecystectomy, Care After This sheet gives you information about how to care for yourself after your procedure. Your doctor may also give you more specific instructions. If you have problems or questions, contact your doctor. Follow these instructions at home: Care for cuts from surgery (incisions)   Follow instructions from your doctor about how to take care of your cuts from surgery. Make sure you: ? Wash your hands with soap and water before you change your bandage (dressing). If you cannot use soap and water, use hand sanitizer. ? Change your bandage as told by your doctor. ? Leave stitches (sutures), skin glue, or skin tape (adhesive) strips in place. They may need to stay in place for 2 weeks or longer. If tape strips get loose and curl up, you may trim the loose edges. Do not remove tape strips completely unless your doctor says it is okay.  Do not take baths, swim, or use a hot tub until your doctor says it is okay. OK TO SHOWER 24HRS AFTER YOUR SURGERY.   Check your surgical cut area every day for signs of infection. Check for: ? More redness, swelling, or pain. ? More fluid or blood. ? Warmth. ? Pus or a bad smell. Activity  Do not drive or use heavy machinery while taking prescription pain medicine.  Do not play contact sports until your doctor says it is okay.  Do not drive for 24 hours if you were given a medicine to help you relax (sedative).  Rest as needed. Do not return to work or school until your doctor says it is okay. General instructions .  tylenol and advil as needed for discomfort.  Please alternate between the two every four hours as needed for pain.   .  Use narcotics, if prescribed, only when tylenol and motrin is not enough to control pain. .  325-650mg every 8hrs to max of 3000mg/24hrs (including the 325mg in every norco dose) for the tylenol.   .  Advil up to 800mg per dose every 8hrs as needed for pain.    To prevent or treat constipation  while you are taking prescription pain medicine, your doctor may recommend that you: ? Drink enough fluid to keep your pee (urine) clear or pale yellow. ? Take over-the-counter or prescription medicines. ? Eat foods that are high in fiber, such as fresh fruits and vegetables, whole grains, and beans. ? Limit foods that are high in fat and processed sugars, such as fried and sweet foods. Contact a doctor if:  You develop a rash.  You have more redness, swelling, or pain around your surgical cuts.  You have more fluid or blood coming from your surgical cuts.  Your surgical cuts feel warm to the touch.  You have pus or a bad smell coming from your surgical cuts.  You have a fever.  One or more of your surgical cuts breaks open. Get help right away if:  You have trouble breathing.  You have chest pain.  You have pain that is getting worse in your shoulders.  You faint or feel dizzy when you stand.  You have very bad pain in your belly (abdomen).  You are sick to your stomach (nauseous) for more than one day.  You have throwing up (vomiting) that lasts for more than one day.  You have leg pain. This information is not intended to replace advice given to you by your health care provider. Make sure you discuss any questions you have with your   health care provider. Document Released: 05/17/2008 Document Revised: 02/27/2016 Document Reviewed: 01/25/2016 Elsevier Interactive Patient Education  2019 [employer]   

## 2020-06-26 NOTE — Plan of Care (Signed)
Continuing with plan of care. 

## 2020-06-29 LAB — SURGICAL PATHOLOGY

## 2020-07-07 ENCOUNTER — Other Ambulatory Visit: Payer: Self-pay | Admitting: Surgery

## 2020-07-07 DIAGNOSIS — R109 Unspecified abdominal pain: Secondary | ICD-10-CM

## 2020-07-14 ENCOUNTER — Ambulatory Visit
Admission: RE | Admit: 2020-07-14 | Discharge: 2020-07-14 | Disposition: A | Discharge status: Home / Self Care | Payer: No Typology Code available for payment source | Source: Ambulatory Visit | Attending: Surgery | Admitting: Surgery

## 2020-07-14 ENCOUNTER — Other Ambulatory Visit: Payer: Self-pay

## 2020-07-14 DIAGNOSIS — R109 Unspecified abdominal pain: Secondary | ICD-10-CM | POA: Diagnosis present

## 2020-07-23 ENCOUNTER — Ambulatory Visit: Payer: Self-pay | Admitting: Surgery

## 2020-07-23 NOTE — H&P (Signed)
Subjective:   CC: Acute cholecystitis [K81.0] POSTOP  HPI:  [name] is a 44 y.o. female who is here for followup from above.  Occasional RUQ and shoulder pain.  New complaint of supraumbilical bulge.  Present for few years, told it was hernia.  Slight TTP.  Will like to discuss possible repair.     Current Medications: has a current medication list which includes the following prescription(s): ibuprofen.  Allergies:       Allergies  Allergen Reactions  . Prednisone Palpitations    REACTION: Rapid heart rate, face flushed  . Prozac [Fluoxetine] Other (See Comments)    Break with reality.    ROS: General: Denies weight loss, weight gain, fatigue, fevers, chills, and night sweats. Heart: Denies chest pain, palpitations, racing heart, irregular heartbeat, leg pain or swelling, and decreased activity tolerance. Respiratory: Denies breathing difficulty, shortness of breath, wheezing, cough, and sputum. GI: Denies change in appetite, heartburn, nausea, vomiting, constipation, diarrhea, and blood in stool. GU: Denies difficulty urinating, pain with urinating, urgency, frequency, blood in urine    Objective:   BP 120/84   Pulse 77   Ht 149.9 cm (4\' 11" )   Wt 68 kg (150 lb)   BMI 30.30 kg/m   Constitutional :  alert, appears stated age, cooperative and no distress  Gastrointestinal: soft, non-tender; bowel sounds normal; no masses,  no organomegaly.  Possible supraumbilical hernia within diastasis recti, moderate size, reducible, minimal ttp.  Musculoskeletal: Steady gait and movement  Skin: Cool and moist, incisions clean, dry, intact.  No erythema, induration or drainage to indicate infection.    Psychiatric: Normal affect, non-agitated, not confused       LABS:  SURGICAL PATHOLOGY  CASE: [phone]  PATIENT: [id number]  Surgical Pathology Report      Specimen Submitted:  A. Gallbladder   Clinical History: Acute cholelithiasis.       DIAGNOSIS:  A. GALLBLADDER; CHOLECYSTECTOMY:  - CHOLELITHIASISAND ACUTE CHOLECYSTITIS WITH FOCAL GANGRENE, ON A  BACKGROUND OF CHRONIC CHOLECYSTITIS AND CHOLESTEROLOSIS.  - NEGATIVE FOR DYSPLASIA AND MALIGNANCY.   GROSS DESCRIPTION:  A. Labeled: Gallbladder  Received: In formalin  Size of specimen: 8.3 x 3.5 x 2 cm  Specimen integrity: Intact  External surface: Dusky red and smooth  Wall thickness: 0.2-0.4 cm  Mucosa: Green-tan with a yellow reticular pattern  Cystic duct: 0.5 cm  Bile present: Yes, green watery  Stones present: There are several pale-yellow calculi noted from 0.7 up  to 1.3 cm in diameter noted  Other findings: None   Block summary:  1 -representative sections    Final Diagnosis performed by Jocelyn Lamer, MD.  Electronically signed  06/29/2020 4:39:54PM  The electronic signature indicates that the named Attending Pathologist  has evaluated the specimen  Technical component performed at [facility], [address] [place],  [place] Lab: [phone] Dir: [phone], MD, MMM  Professional component performed at [facility], [facility], [address] [place], [place], [place] Lab: [phone]  Dir: [phone]. Rubinas, MD  RADS: N/A  Assessment:    1.  Acute cholecystitis [K81.0] s/p robo lap chole 2. abodminal bulge, diastasis recti with possible supraumbilical hernia within diastasis  Plan:   1. Healing well.  No issues.  Healing ridge will flatten and resolve on its own.  Continue activity restrictions as previously discussed.   Subsequent CT confirmed ventral hernia, patient elected for open repair, due to size, possible mesh.  Discussed the risk of surgery including recurrence, which  can be up to 50% in the case of incisional or complex hernias, possible use of prosthetic materials (mesh) and the increased risk of mesh infxn if used, bleeding, chronic pain, post-op infxn, post-op SBO or ileus, and possible  re-operation to address said risks. The risks of general anesthetic, if used, includes MI, CVA, sudden death or even reaction to anesthetic medications also discussed. Alternatives include continued observation.  Benefits include possible symptom relief, prevention of incarceration, strangulation, enlargement in size over time, and the risk of emergency surgery in the face of strangulation.   Typical post-op recovery time of 3-5 days with 2 weeks of activity restrictions were also discussed.  ED return precautions given for sudden increase in pain, size of hernia with accompanying fever, nausea, and/or vomiting.  The patient verbalized understanding and all questions were answered to the patient's satisfaction.

## 2020-07-28 NOTE — H&P (Signed)
Subjective:   CC: Acute cholecystitis [K81.0] POSTOP  HPI:  [name] is a 44 y.o. female who is here for followup from above.  Occasional RUQ and shoulder pain.  New complaint of supraumbilical bulge.  Present for few years, told it was hernia.  Slight TTP.  Will like to discuss possible repair.     Current Medications: has a current medication list which includes the following prescription(s): ibuprofen.  Allergies:       Allergies  Allergen Reactions  . Prednisone Palpitations    REACTION: Rapid heart rate, face flushed  . Prozac [Fluoxetine] Other (See Comments)    Break with reality.    ROS: General: Denies weight loss, weight gain, fatigue, fevers, chills, and night sweats. Heart: Denies chest pain, palpitations, racing heart, irregular heartbeat, leg pain or swelling, and decreased activity tolerance. Respiratory: Denies breathing difficulty, shortness of breath, wheezing, cough, and sputum. GI: Denies change in appetite, heartburn, nausea, vomiting, constipation, diarrhea, and blood in stool. GU: Denies difficulty urinating, pain with urinating, urgency, frequency, blood in urine    Objective:   BP 120/84   Pulse 77   Ht 149.9 cm (4\' 11" )   Wt 68 kg (150 lb)   BMI 30.30 kg/m   Constitutional :  alert, appears stated age, cooperative and no distress  Gastrointestinal: soft, non-tender; bowel sounds normal; no masses,  no organomegaly.  Possible supraumbilical hernia within diastasis recti, moderate size, reducible, minimal ttp.  Musculoskeletal: Steady gait and movement  Skin: Cool and moist, incisions clean, dry, intact.  No erythema, induration or drainage to indicate infection.    Psychiatric: Normal affect, non-agitated, not confused       LABS:  SURGICAL PATHOLOGY  CASE: [phone]  PATIENT: [id number]  Surgical Pathology Report      Specimen Submitted:  A. Gallbladder   Clinical History: Acute cholelithiasis.       DIAGNOSIS:  A. GALLBLADDER; CHOLECYSTECTOMY:  - CHOLELITHIASISAND ACUTE CHOLECYSTITIS WITH FOCAL GANGRENE, ON A  BACKGROUND OF CHRONIC CHOLECYSTITIS AND CHOLESTEROLOSIS.  - NEGATIVE FOR DYSPLASIA AND MALIGNANCY.   GROSS DESCRIPTION:  A. Labeled: Gallbladder  Received: In formalin  Size of specimen: 8.3 x 3.5 x 2 cm  Specimen integrity: Intact  External surface: Dusky red and smooth  Wall thickness: 0.2-0.4 cm  Mucosa: Green-tan with a yellow reticular pattern  Cystic duct: 0.5 cm  Bile present: Yes, green watery  Stones present: There are several pale-yellow calculi noted from 0.7 up  to 1.3 cm in diameter noted  Other findings: None   Block summary:  1 -representative sections    Final Diagnosis performed by Jocelyn Lamer, MD.  Electronically signed  06/29/2020 4:39:54PM  The electronic signature indicates that the named Attending Pathologist  has evaluated the specimen  Technical component performed at [facility], [address], [place],  [place] Lab: [phone] Dir: [phone], MD, MMM  Professional component performed at [facility], [facility], [address] [place], [place], [place] Lab: [phone]  Dir: [phone]. Rubinas, MD  RADS: N/A  Assessment:    1.  Acute cholecystitis [K81.0] s/p robo lap chole 2. abodminal bulge, diastasis recti with possible supraumbilical hernia within diastasis  Plan:   1. Healing well.  No issues.  Healing ridge will flatten and resolve on its own.  Continue activity restrictions as previously discussed.   2. Will proceed with CP a/p to see if hernia vs continued diastasis of area of concern.  Office will call back with results  once available  All questions addressed.    UPDATE:  Called patient about CT results.  She will like to consider surgical repair.  Discussed open repair due to small size of hernia.   Discussed the risk of surgery including recurrence, which can be up to  50% in the case of incisional or complex hernias, possible use of prosthetic materials (mesh) and the increased risk of mesh infxn if used, bleeding, chronic pain, post-op infxn, post-op SBO or ileus, and possible re-operation to address said risks. The risks of general anesthetic, if used, includes MI, CVA, sudden death or even reaction to anesthetic medications also discussed. Alternatives include continued observation.  Benefits include possible symptom relief, prevention of incarceration, strangulation, enlargement in size over time, and the risk of emergency surgery in the face of strangulation.   Typical post-op recovery time of 3-5 days with 2 weeks of activity restrictions were also discussed.  ED return precautions given for sudden increase in pain, size of hernia with accompanying fever, nausea, and/or vomiting.  The patient verbalized understanding and all questions were answered to the patient's satisfaction.

## 2020-09-07 ENCOUNTER — Other Ambulatory Visit: Admission: RE | Admit: 2020-09-07 | Payer: No Typology Code available for payment source | Source: Ambulatory Visit

## 2020-09-16 ENCOUNTER — Other Ambulatory Visit: Payer: No Typology Code available for payment source

## 2020-09-18 ENCOUNTER — Ambulatory Visit: Admit: 2020-09-18 | Payer: No Typology Code available for payment source | Admitting: Surgery

## 2020-09-18 SURGERY — REPAIR, HERNIA, VENTRAL
Anesthesia: General

## 2021-11-17 IMAGING — US US ABDOMEN LIMITED
1 series · 14 of 25 positions shown · non-contrast
Comparison: Abdominal ultrasound 03/07/2017

CLINICAL DATA: Upper abdominal pain.

EXAM:
ULTRASOUND ABDOMEN LIMITED RIGHT UPPER QUADRANT

[Series 1: us abdomen limited ruq (liver/gb) · 14 of 49 slices shown]
[im 1/49]
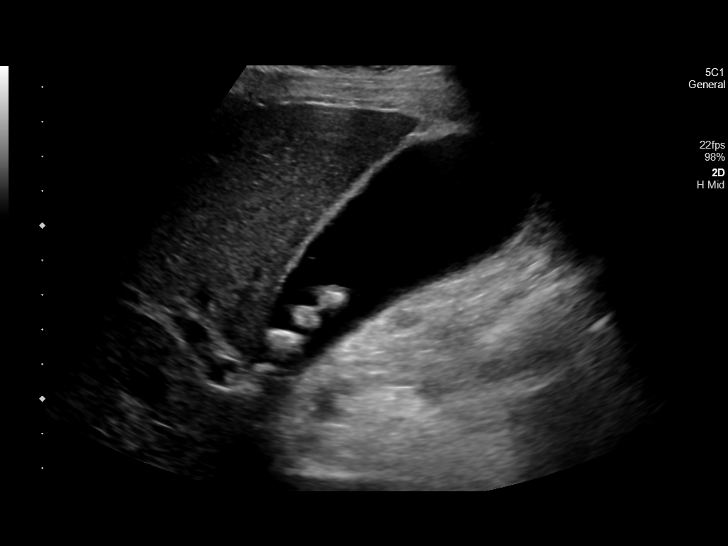
[im 5/49]
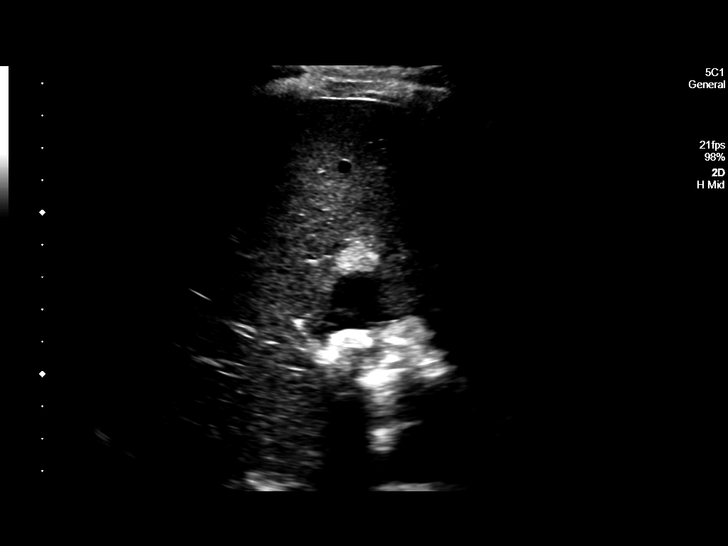
[im 9/49]
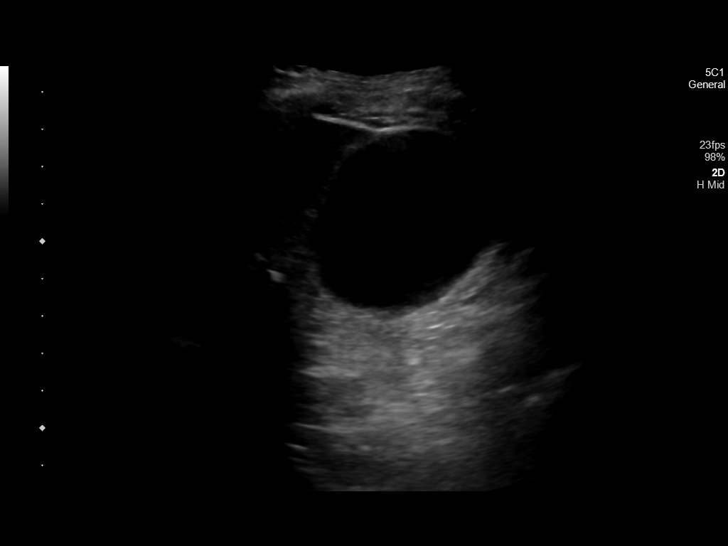
[im 13/49]
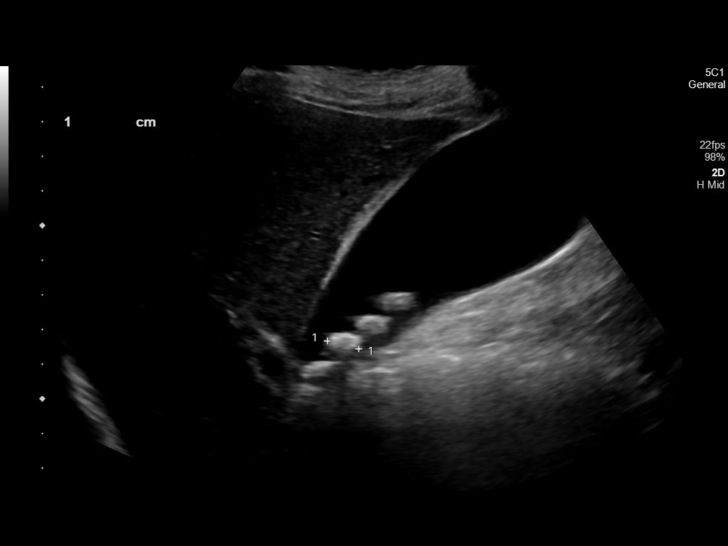
[im 17/49]
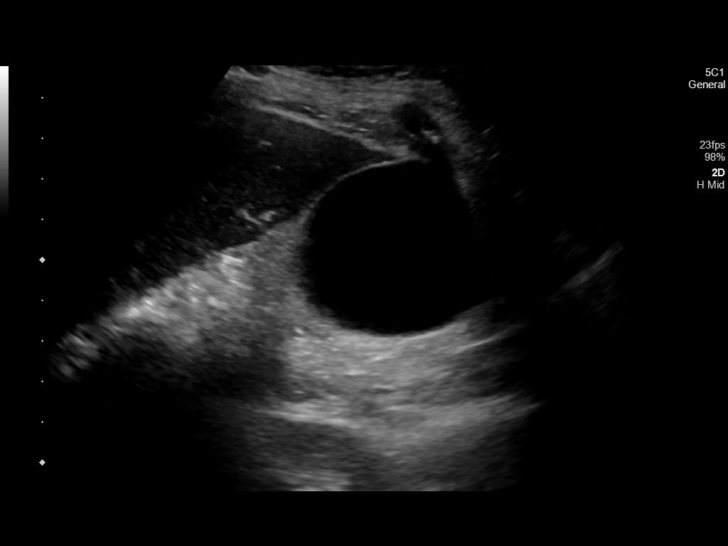
[im 19/49]
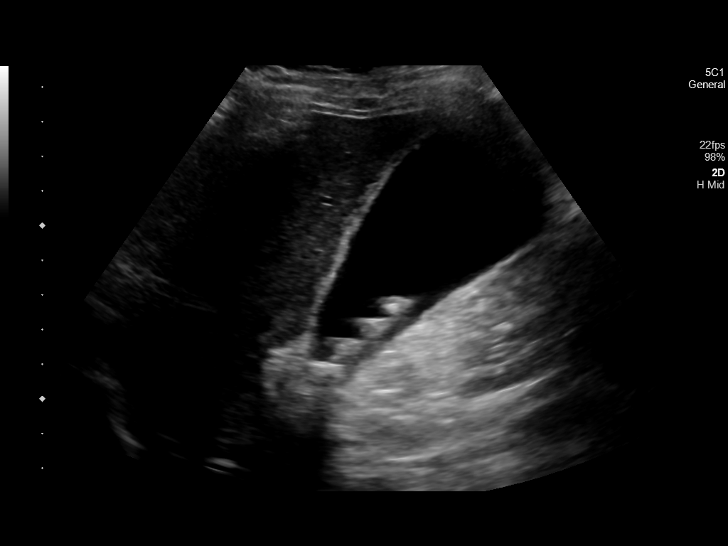
[im 23/49]
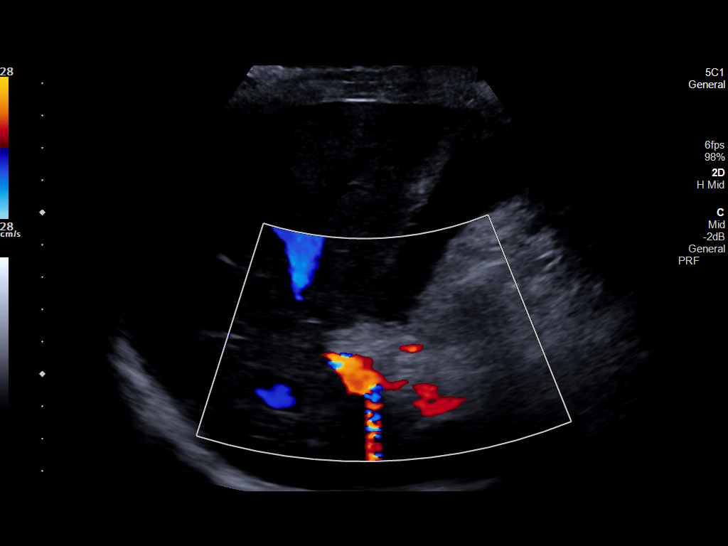
[im 27/49]
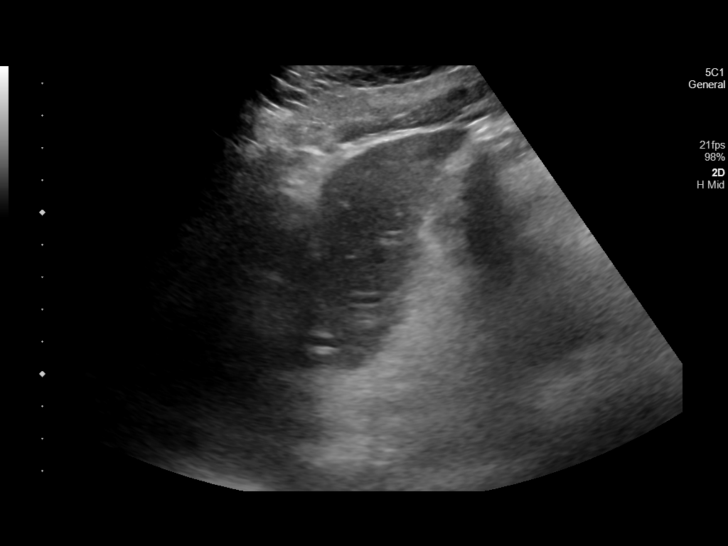
[im 31/49]
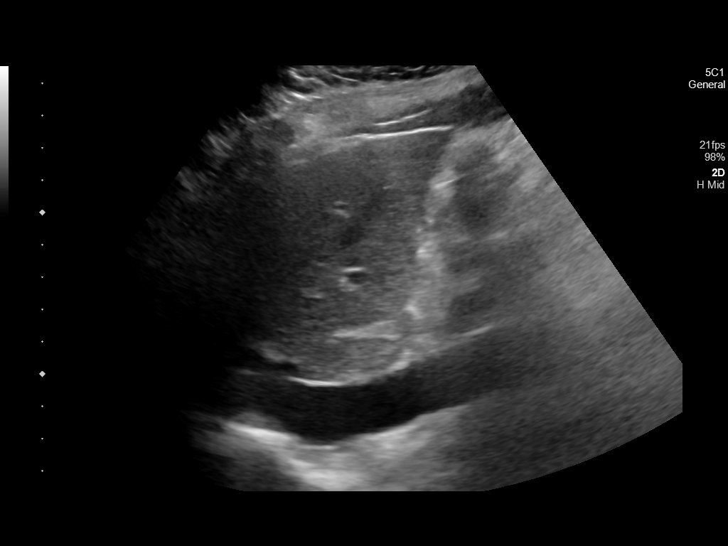
[im 33/49]
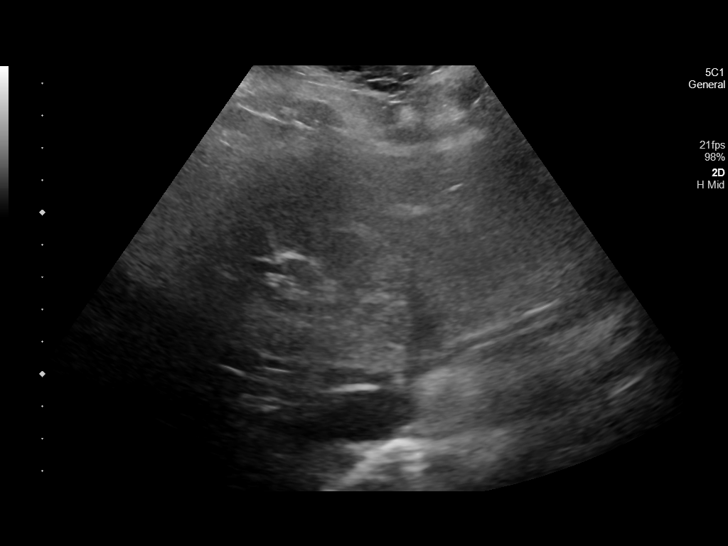
[im 37/49]
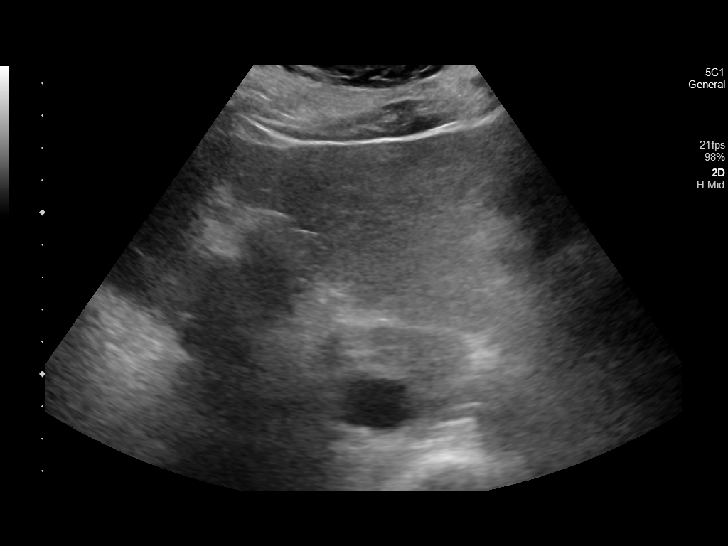
[im 41/49]
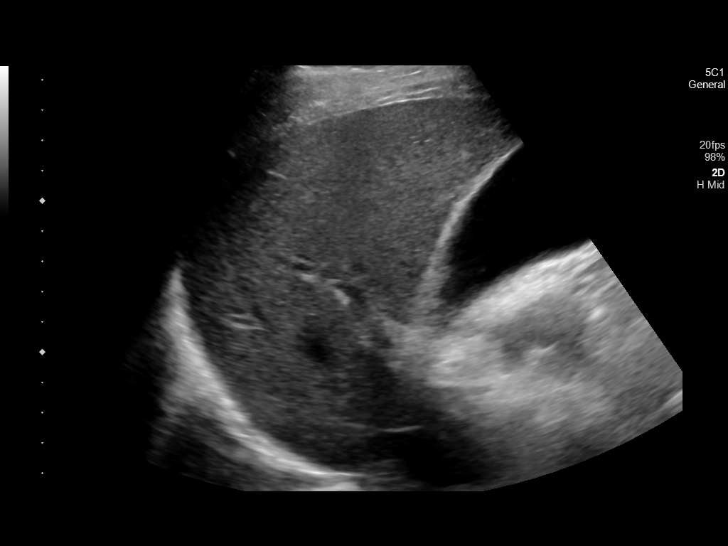
[im 45/49]
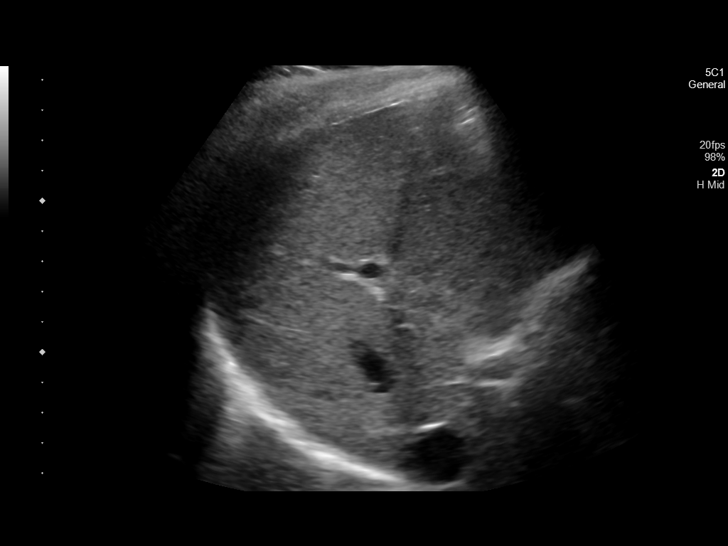
[im 49/49]
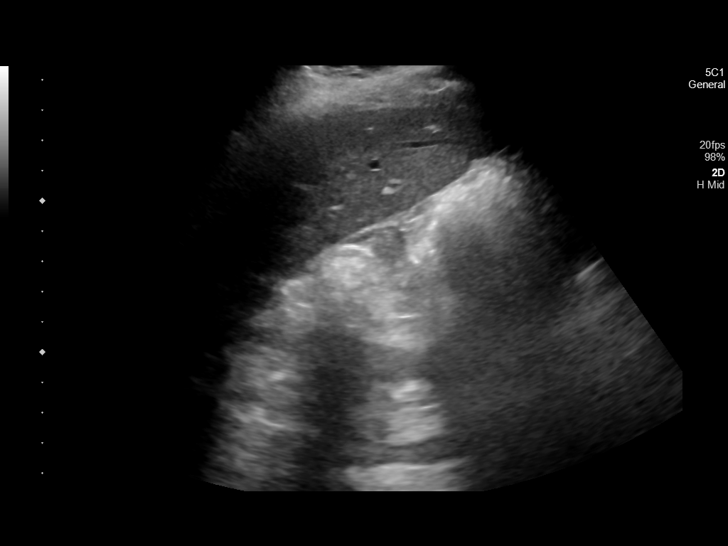

[14 of 25 positions shown; findings below may reference images not displayed]

FINDINGS: Gallbladder:

Gallbladder is distended. There are multiple shadowing stones
measuring up to 0.9 cm. There is mild gallbladder wall thickening
measuring 0.4 cm and possible trace pericholecystic fluid. Negative
sonographic Murphy sign.

Common bile duct:

Diameter: 0.2 cm.

Liver:

No focal lesion identified. Within normal limits in parenchymal
echogenicity. Portal vein is patent on color Doppler imaging with
normal direction of blood flow towards the liver.

Other: None.
IMPRESSION: Distended gallbladder with mild wall thickening and gallstones.
Possible trace pericholecystic fluid. Negative sonographic Murphy
sign. Findings are technically indeterminate though raise the
possibility of early acute cholecystitis in the appropriate clinical
setting.

## 2021-12-06 IMAGING — CT CT ABD-PELV W/O CM
1 of 2 series · 15 of 32 positions shown, 19 images · non-contrast
Comparison: None.

CLINICAL DATA: Chronic pain above the navel for 9 years, site
marked

EXAM:
CT ABDOMEN AND PELVIS WITHOUT CONTRAST
TECHNIQUE: Multidetector CT imaging of the abdomen and pelvis was performed
following the standard protocol without IV contrast.

[Series 2: axial st · axial · 0.68mm/px · z∈[-731,-351]mm · 15 of 84 slices shown, 19 images]
[im 4/84  soft-tissue]
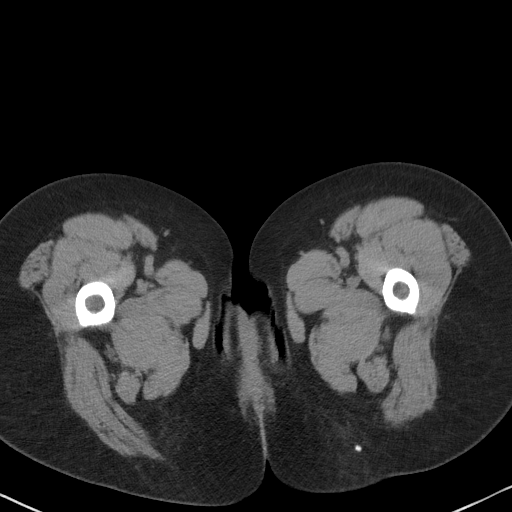
[im 4/84  bone]
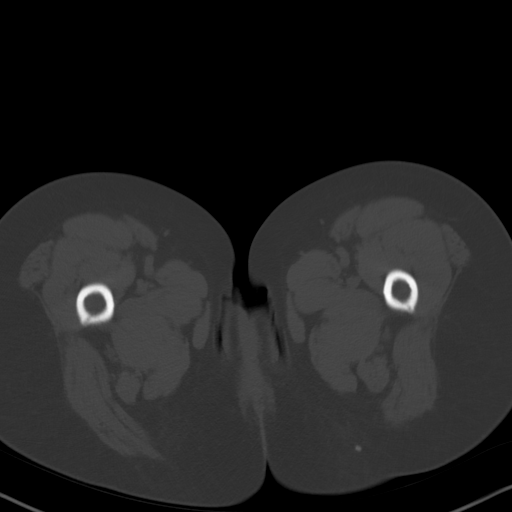
[im 11/84  soft-tissue]
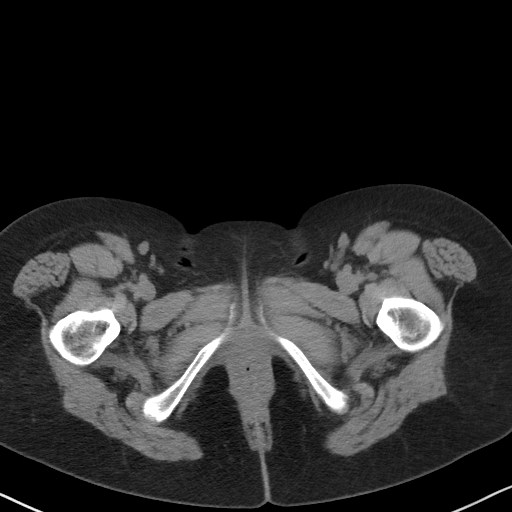
[im 18/84  soft-tissue]
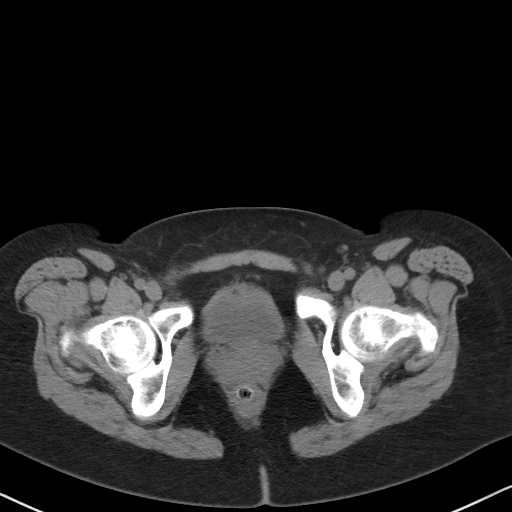
[im 25/84  soft-tissue]
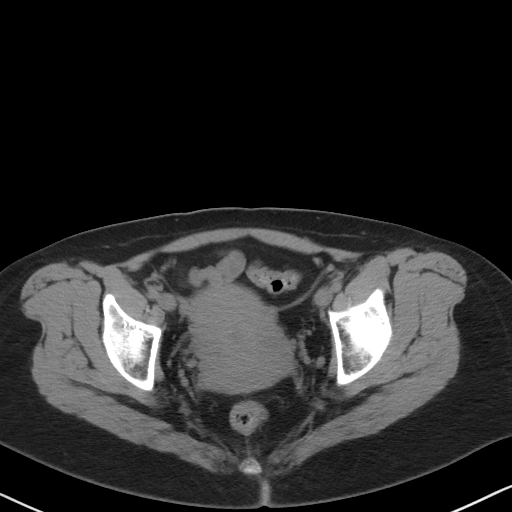
[im 28/84  soft-tissue]
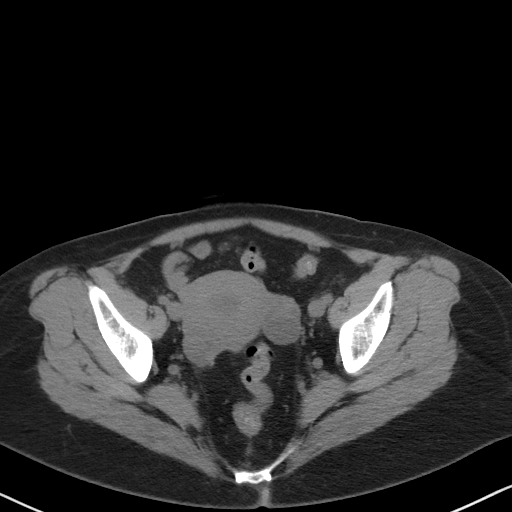
[im 35/84  soft-tissue]
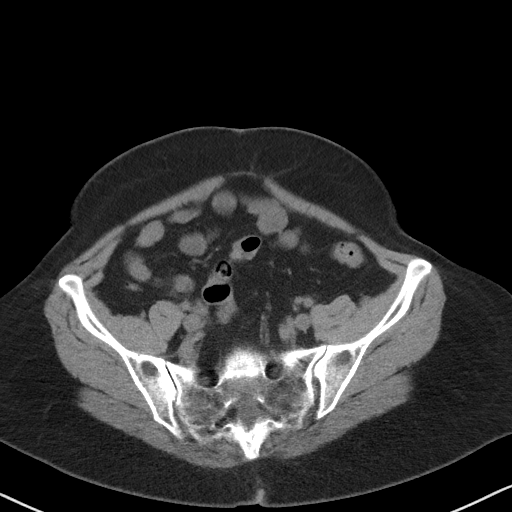
[im 42/84  soft-tissue]
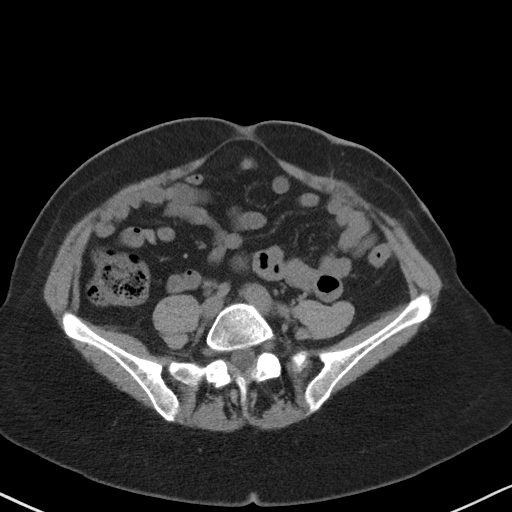
[im 49/84  soft-tissue]
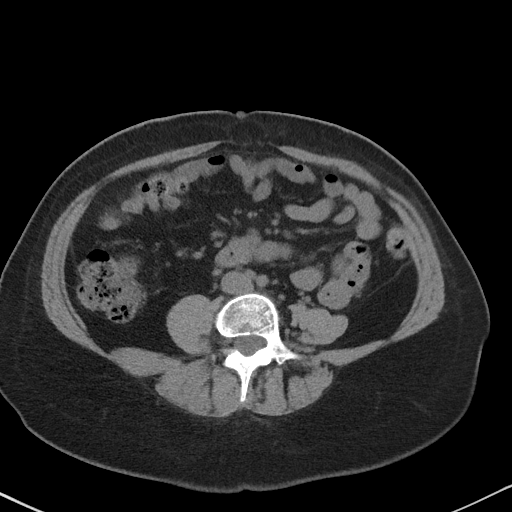
[im 56/84  soft-tissue]
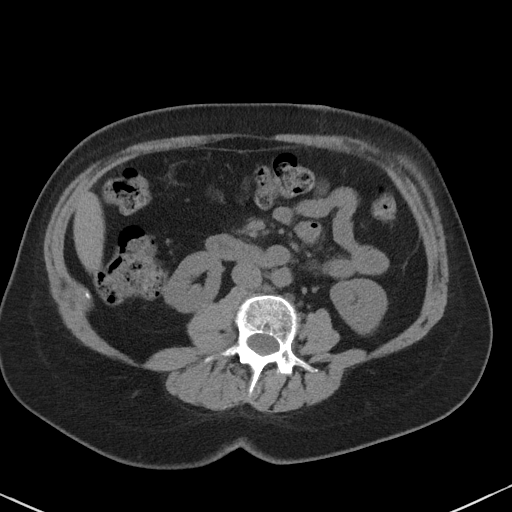
[im 56/84  bone]
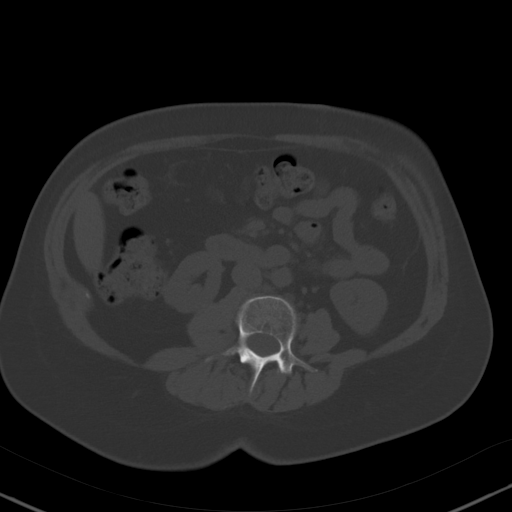
[im 59/84  soft-tissue]
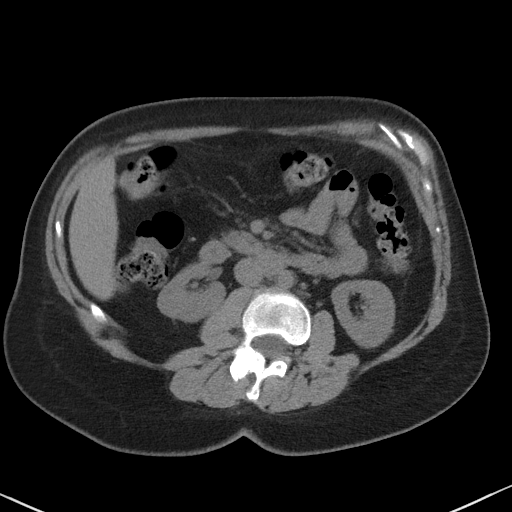
[im 66/84  soft-tissue]
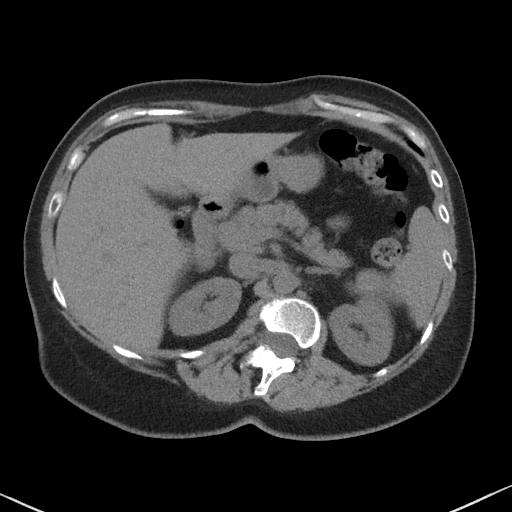
[im 70/84  lung]
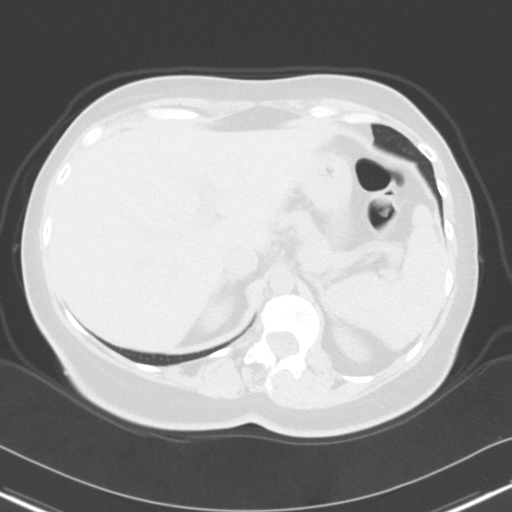
[im 73/84  soft-tissue]
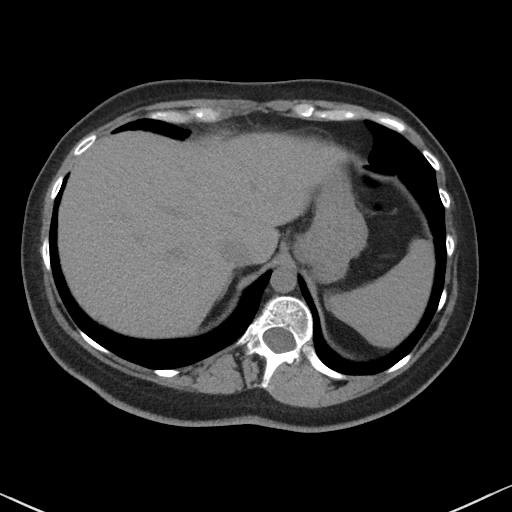
[im 73/84  lung]
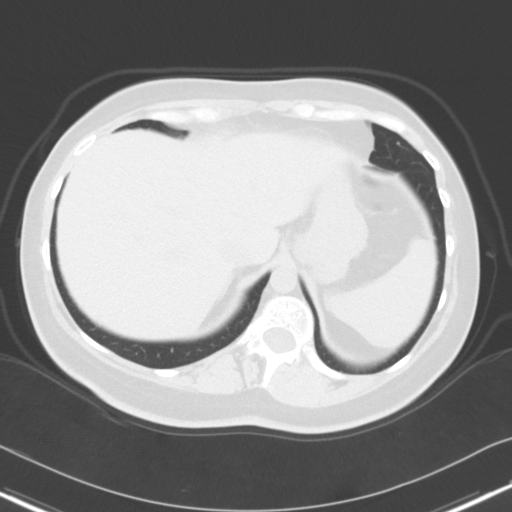
[im 77/84  lung]
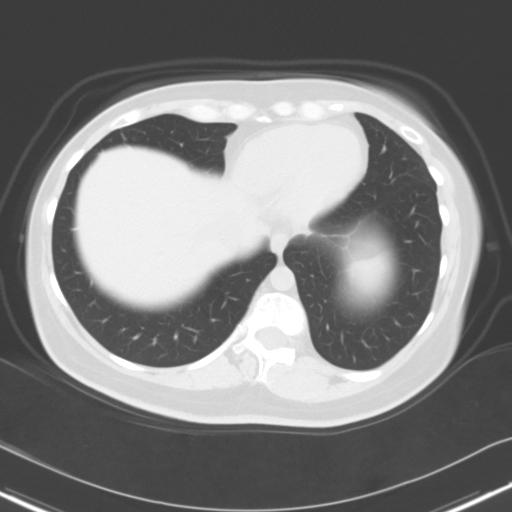
[im 80/84  soft-tissue]
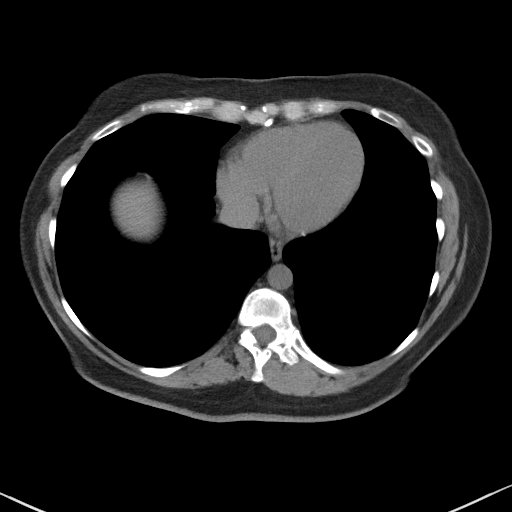
[im 80/84  lung]
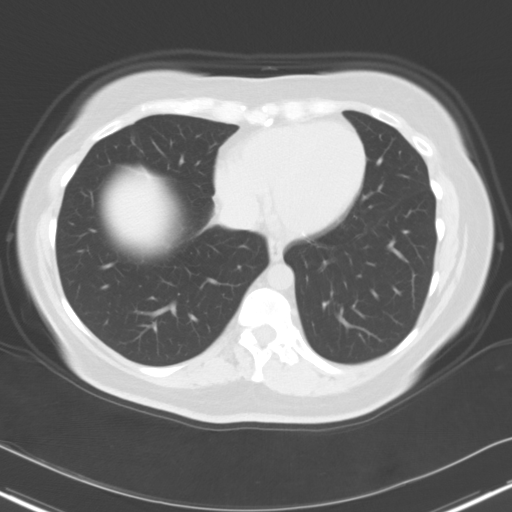

[15 of 32 positions shown; findings below may reference images not displayed]

FINDINGS: Lower chest: No acute abnormality.

Hepatobiliary: No solid liver abnormality is seen. No gallstones,
gallbladder wall thickening, or biliary dilatation.

Pancreas: Unremarkable. No pancreatic ductal dilatation or
surrounding inflammatory changes.

Spleen: Normal in size without significant abnormality.

Adrenals/Urinary Tract: Adrenal glands are unremarkable. Kidneys are
normal, without renal calculi, solid lesion, or hydronephrosis.
Bladder is unremarkable.

Stomach/Bowel: Stomach is within normal limits. Appendix appears
normal. No evidence of bowel wall thickening, distention, or
inflammatory changes.

Vascular/Lymphatic: No significant vascular findings are present. No
enlarged abdominal or pelvic lymph nodes.

Reproductive: No mass or other significant abnormality.

Other: There is a small, fat containing midline epigastric hernia
underlying the marked site, hernia sac measures 3.9 cm, hernia neck
measuring approximately 0.9 cm. No evidence of bowel involvement. No
abdominopelvic ascites.

Musculoskeletal: No acute or significant osseous findings.
IMPRESSION: There is a small, fat containing midline epigastric hernia
underlying the marked site, hernia sac measures 3.9 cm, hernia neck
measuring approximately 0.9 cm. No evidence of bowel involvement.

## 2022-05-26 ENCOUNTER — Ambulatory Visit: Payer: No Typology Code available for payment source | Admitting: Dermatology

## 2024-06-12 ENCOUNTER — Other Ambulatory Visit: Payer: Self-pay

## 2024-06-12 ENCOUNTER — Telehealth: Payer: Self-pay

## 2024-06-12 DIAGNOSIS — Z1211 Encounter for screening for malignant neoplasm of colon: Secondary | ICD-10-CM

## 2024-06-12 MED ORDER — NA SULFATE-K SULFATE-MG SULF 17.5-3.13-1.6 GM/177ML PO SOLN: 1.0000 | Freq: Once | ORAL | 0 refills | Status: AC

## 2024-06-12 NOTE — Telephone Encounter (Signed)
 Gastroenterology Pre-Procedure Review  Request Date: 08/30/24 Requesting Physician: Dr. Melany  PATIENT REVIEW QUESTIONS: The patient responded to the following health history questions as indicated:    NOTE: Pt has family history of malignant hypothermia  1. Are you having any GI issues? no 2. Do you have a personal history of Polyps? no 3. Do you have a family history of Colon Cancer or Polyps? yes (paternal great aunt had colon cancer) 4. Diabetes Mellitus? no 5. Joint replacements in the past 12 months?no 6. Major health problems in the past 3 months?no 7. Any artificial heart valves, MVP, or defibrillator?no    MEDICATIONS & ALLERGIES:    Patient reports the following regarding taking any anticoagulation/antiplatelet therapy:   Plavix, Coumadin, Eliquis, Xarelto, Lovenox , Pradaxa, Brilinta, or Effient? no Aspirin? no  Patient confirms/reports the following medications:  Current Outpatient Medications  Medication Sig Dispense Refill   HYDROcodone -acetaminophen  (NORCO) 5-325 MG tablet Take 1 tablet by mouth every 6 (six) hours as needed for up to 6 doses for moderate pain. (Patient not taking: Reported on 09/01/2020) 6 tablet 0   No current facility-administered medications for this visit.    Patient confirms/reports the following allergies:  Allergies  Allergen Reactions   Fluoxetine Other (See Comments)    Break with reality.     Prednisone Palpitations    REACTION: Rapid heart rate, face flushed     No orders of the defined types were placed in this encounter.   AUTHORIZATION INFORMATION Primary Insurance: 1D#: Group #:  Secondary Insurance: 1D#: Group #:  SCHEDULE INFORMATION: Date: 08/30/24 Time: Location: [facility]

## 2024-10-11 ENCOUNTER — Ambulatory Visit: Admit: 2024-10-11 | Admitting: Gastroenterology

## 2024-10-11 SURGERY — COLONOSCOPY
Anesthesia: Choice
